# Patient Record
Sex: Male | Born: 1950 | Race: White | Hispanic: No | Marital: Single
Health system: Southern US, Community
[De-identification: ages and names within clinical notes are randomized; demographics above are authoritative.]

## PROBLEM LIST (undated history)

## (undated) DIAGNOSIS — I10 Essential (primary) hypertension: Secondary | ICD-10-CM

## (undated) DIAGNOSIS — E119 Type 2 diabetes mellitus without complications: Secondary | ICD-10-CM

---

## 2020-08-20 ENCOUNTER — Other Ambulatory Visit: Payer: Self-pay

## 2020-08-20 ENCOUNTER — Emergency Department (HOSPITAL_COMMUNITY): Payer: Medicare (Managed Care)

## 2020-08-20 ENCOUNTER — Encounter (HOSPITAL_COMMUNITY): Payer: Self-pay

## 2020-08-20 ENCOUNTER — Inpatient Hospital Stay (HOSPITAL_COMMUNITY)
Admission: EM | Admit: 2020-08-20 | Discharge: 2020-08-23 | DRG: 093 | Disposition: A | Discharge status: Home / Self Care | Payer: Medicare (Managed Care) | Attending: Internal Medicine | Admitting: Internal Medicine

## 2020-08-20 DIAGNOSIS — R270 Ataxia, unspecified: Secondary | ICD-10-CM

## 2020-08-20 DIAGNOSIS — I6389 Other cerebral infarction: Secondary | ICD-10-CM | POA: Diagnosis not present

## 2020-08-20 DIAGNOSIS — I6522 Occlusion and stenosis of left carotid artery: Secondary | ICD-10-CM | POA: Diagnosis present

## 2020-08-20 DIAGNOSIS — G43109 Migraine with aura, not intractable, without status migrainosus: Secondary | ICD-10-CM | POA: Diagnosis present

## 2020-08-20 DIAGNOSIS — Z20822 Contact with and (suspected) exposure to covid-19: Secondary | ICD-10-CM | POA: Diagnosis present

## 2020-08-20 DIAGNOSIS — H5500 Unspecified nystagmus: Secondary | ICD-10-CM | POA: Diagnosis present

## 2020-08-20 DIAGNOSIS — Z7984 Long term (current) use of oral hypoglycemic drugs: Secondary | ICD-10-CM | POA: Diagnosis not present

## 2020-08-20 DIAGNOSIS — Z7982 Long term (current) use of aspirin: Secondary | ICD-10-CM

## 2020-08-20 DIAGNOSIS — I639 Cerebral infarction, unspecified: Secondary | ICD-10-CM | POA: Diagnosis not present

## 2020-08-20 DIAGNOSIS — Z8673 Personal history of transient ischemic attack (TIA), and cerebral infarction without residual deficits: Secondary | ICD-10-CM | POA: Diagnosis not present

## 2020-08-20 DIAGNOSIS — Z79899 Other long term (current) drug therapy: Secondary | ICD-10-CM | POA: Diagnosis not present

## 2020-08-20 DIAGNOSIS — I10 Essential (primary) hypertension: Secondary | ICD-10-CM | POA: Diagnosis present

## 2020-08-20 DIAGNOSIS — I671 Cerebral aneurysm, nonruptured: Principal | ICD-10-CM

## 2020-08-20 DIAGNOSIS — E7800 Pure hypercholesterolemia, unspecified: Secondary | ICD-10-CM | POA: Diagnosis not present

## 2020-08-20 DIAGNOSIS — R42 Dizziness and giddiness: Secondary | ICD-10-CM | POA: Diagnosis present

## 2020-08-20 DIAGNOSIS — Z823 Family history of stroke: Secondary | ICD-10-CM

## 2020-08-20 DIAGNOSIS — E119 Type 2 diabetes mellitus without complications: Secondary | ICD-10-CM | POA: Diagnosis present

## 2020-08-20 DIAGNOSIS — I2541 Coronary artery aneurysm: Secondary | ICD-10-CM

## 2020-08-20 DIAGNOSIS — I672 Cerebral atherosclerosis: Secondary | ICD-10-CM | POA: Diagnosis present

## 2020-08-20 DIAGNOSIS — R278 Other lack of coordination: Secondary | ICD-10-CM | POA: Diagnosis present

## 2020-08-20 HISTORY — DX: Essential (primary) hypertension: I10

## 2020-08-20 HISTORY — DX: Type 2 diabetes mellitus without complications: E11.9

## 2020-08-20 LAB — I-STAT CHEM 8, ED
BUN: 20 mg/dL (ref 8–23)
Calcium, Ion: 1.17 mmol/L (ref 1.15–1.40)
Chloride: 109 mmol/L (ref 98–111)
Creatinine, Ser: 1.2 mg/dL (ref 0.61–1.24)
Glucose, Bld: 145 mg/dL — ABNORMAL HIGH (ref 70–99)
HCT: 45 % (ref 39.0–52.0)
Hemoglobin: 15.3 g/dL (ref 13.0–17.0)
Potassium: 4.4 mmol/L (ref 3.5–5.1)
Sodium: 139 mmol/L (ref 135–145)
TCO2: 22 mmol/L (ref 22–32)

## 2020-08-20 LAB — PROTIME-INR
INR: 1 (ref 0.8–1.2)
Prothrombin Time: 12.4 seconds (ref 11.4–15.2)

## 2020-08-20 LAB — COMPREHENSIVE METABOLIC PANEL
ALT: 31 U/L (ref 0–44)
AST: 23 U/L (ref 15–41)
Albumin: 4.3 g/dL (ref 3.5–5.0)
Alkaline Phosphatase: 50 U/L (ref 38–126)
Anion gap: 9 (ref 5–15)
BUN: 16 mg/dL (ref 8–23)
CO2: 23 mmol/L (ref 22–32)
Calcium: 9.7 mg/dL (ref 8.9–10.3)
Chloride: 106 mmol/L (ref 98–111)
Creatinine, Ser: 1.21 mg/dL (ref 0.61–1.24)
GFR, Estimated: >60 mL/min (ref >60)
Glucose, Bld: 153 mg/dL — ABNORMAL HIGH (ref 70–99)
Potassium: 4.4 mmol/L (ref 3.5–5.1)
Sodium: 138 mmol/L (ref 135–145)
Total Bilirubin: 0.8 mg/dL (ref 0.3–1.2)
Total Protein: 7.1 g/dL (ref 6.5–8.1)

## 2020-08-20 LAB — DIFFERENTIAL
Abs Immature Granulocytes: 0.01 10*3/uL (ref 0.00–0.07)
Basophils Absolute: 0.1 10*3/uL (ref 0.0–0.1)
Basophils Relative: 1 %
Eosinophils Absolute: 0.1 10*3/uL (ref 0.0–0.5)
Eosinophils Relative: 2 %
Immature Granulocytes: 0 %
Lymphocytes Relative: 28 %
Lymphs Abs: 1.6 10*3/uL (ref 0.7–4.0)
Monocytes Absolute: 0.7 10*3/uL (ref 0.1–1.0)
Monocytes Relative: 13 %
Neutro Abs: 3.2 10*3/uL (ref 1.7–7.7)
Neutrophils Relative %: 56 %

## 2020-08-20 LAB — CBC
HCT: 45.9 % (ref 39.0–52.0)
Hemoglobin: 14.9 g/dL (ref 13.0–17.0)
MCH: 30.9 pg (ref 26.0–34.0)
MCHC: 32.5 g/dL (ref 30.0–36.0)
MCV: 95.2 fL (ref 80.0–100.0)
Platelets: 210 10*3/uL (ref 150–400)
RBC: 4.82 MIL/uL (ref 4.22–5.81)
RDW: 13 % (ref 11.5–15.5)
WBC: 5.7 10*3/uL (ref 4.0–10.5)
nRBC: 0 % (ref 0.0–0.2)

## 2020-08-20 LAB — RESP PANEL BY RT-PCR (FLU A&B, COVID) ARPGX2
Influenza A by PCR: NEGATIVE
Influenza B by PCR: NEGATIVE
SARS Coronavirus 2 by RT PCR: NEGATIVE

## 2020-08-20 LAB — APTT: aPTT: 27 seconds (ref 24–36)

## 2020-08-20 LAB — CBG MONITORING, ED: Glucose-Capillary: 103 mg/dL — ABNORMAL HIGH (ref 70–99)

## 2020-08-20 MED ORDER — METFORMIN HCL 500 MG PO TABS
1000.0000 mg | ORAL_TABLET | Freq: Two times a day (BID) | ORAL | Status: DC
  Administered 2020-08-21: 1000 mg via ORAL
  Filled 2020-08-20: qty 2

## 2020-08-20 MED ORDER — STROKE: EARLY STAGES OF RECOVERY BOOK
Freq: Once | Status: AC
  Filled 2020-08-20: qty 1

## 2020-08-20 MED ORDER — ACETAMINOPHEN 160 MG/5ML PO SOLN: 650.0000 mg | ORAL | Status: DC | PRN

## 2020-08-20 MED ORDER — INSULIN ASPART 100 UNIT/ML ~~LOC~~ SOLN
0.0000 [IU] | Freq: Three times a day (TID) | SUBCUTANEOUS | Status: DC
  Administered 2020-08-23 (×2): 2 [IU] via SUBCUTANEOUS
  Administered 2020-08-23: 3 [IU] via SUBCUTANEOUS

## 2020-08-20 MED ORDER — LOSARTAN POTASSIUM 50 MG PO TABS
100.0000 mg | ORAL_TABLET | Freq: Every day | ORAL | Status: DC
  Administered 2020-08-22 – 2020-08-23 (×2): 100 mg via ORAL
  Filled 2020-08-20 (×2): qty 2

## 2020-08-20 MED ORDER — HYDROCHLOROTHIAZIDE 12.5 MG PO CAPS: 12.5000 mg | ORAL_CAPSULE | Freq: Every day | ORAL | Status: DC

## 2020-08-20 MED ORDER — SODIUM CHLORIDE 0.9% FLUSH
3.0000 mL | Freq: Once | INTRAVENOUS | Status: AC
  Administered 2020-08-20: 3 mL via INTRAVENOUS

## 2020-08-20 MED ORDER — IOHEXOL 350 MG/ML SOLN
75.0000 mL | Freq: Once | INTRAVENOUS | Status: AC | PRN
  Administered 2020-08-20: 75 mL via INTRAVENOUS

## 2020-08-20 MED ORDER — ACETAMINOPHEN 650 MG RE SUPP: 650.0000 mg | RECTAL | Status: DC | PRN

## 2020-08-20 MED ORDER — ATORVASTATIN CALCIUM 40 MG PO TABS
40.0000 mg | ORAL_TABLET | Freq: Every day | ORAL | Status: DC
  Administered 2020-08-22 – 2020-08-23 (×2): 40 mg via ORAL
  Filled 2020-08-20 (×2): qty 1

## 2020-08-20 MED ORDER — ACETAMINOPHEN 325 MG PO TABS: 650.0000 mg | ORAL_TABLET | ORAL | Status: DC | PRN

## 2020-08-20 NOTE — Hospital Course (Signed)
#   Multiple intracranial aneurysms  7 mm left ICA fusiform aneurysm, 5 mm left cavernous left ICA, and bilateral MCA aneurysm. ?? Personal hx of connective tissue disease. No family history of aneurysms.   - Neurology consulted; appreciate their recommendations  - MRI pending  - LP consideration pending MRI results   # HTN Home medication: Losartan 100 mg. Will need tight BP control.   - Restart Losartan 100 mg    # Type 2 Diabetes Mellitus  Home meds: Januvia and Metformin.   - A1c pending - SSI   # HLD - Continue home Atorvastatin   # Hx of Migraines No headache currently

## 2020-08-20 NOTE — ED Notes (Signed)
Off floor to MRI

## 2020-08-20 NOTE — H&P (Addendum)
NAME:  Ricky Santana, MRN:  [id number], DOB:  1951/07/17, LOS: 0 ADMISSION DATE:  08/20/2020, Primary: Pcp, No  CHIEF COMPLAINT:  Gait abnormality   Medical Service: Internal Medicine Teaching Service         Attending Physician: Dr. Anne Shutter, MD    First Contact: Dr. Laural Benes Pager: [phone]  Second Contact: Dr. Huel Cote Pager: [phone]       After Hours (After 5p/  First Contact Pager: [phone]  weekends / holidays): Second Contact Pager: [phone]    History of present illness   72 yom with hypertension and diabetes to presented to [facility] 12/26 for evaluation of dizziness and gait disturbance.  He notes that he awoke in the middle of the night, prior to admission, and was dizzy. He went back to sleep and found that the dizziness was still present. When he attempted to walk, he had to hold onto the walls and railing as he felt off balance. Upon getting downstairs, his sister checked his blood pressure and found it to be 170/90.  He does endorse experiencing a headache and some neck discomfort at the time which has since resolved. Denies recent fever  He denies experiencing chest pain, shortness of breath, palpitations, syncope, focal weakness, speech change.  He is currently visiting family in [place] for the holiday. He resides in [place] and is retired. He denies any recent illness or other symptoms.  He denies prior strokes or heart issues. He notes that his PCP has had to stop some of his blood pressure medications due to lower blood pressures. His baseline blood pressures have been running in the low 100s systolically lately.  He denies illicit substance use or alcohol use preceding the event.  Past Medical History  He,  has a past medical history of Diabetes mellitus without complication (HCC) and Hypertension.   Home Medications     Prior to Admission medications   Medication Sig Start Date End Date Taking? Authorizing Provider  aspirin EC 81 MG tablet Take 81 mg  by mouth daily. Swallow whole.   Yes [provider]  atorvastatin (LIPITOR) 40 MG tablet Take 40 mg by mouth daily. 07/23/20  Yes [provider]  Glucosamine-Chondroitin-MSM (TRIPLE FLEX PO) Take 2 tablets by mouth daily.   Yes [provider]  JANUVIA 100 MG tablet Take 100 mg by mouth daily. 08/12/20  Yes [provider]  losartan (COZAAR) 100 MG tablet Take 100 mg by mouth daily. 06/03/20  Yes [provider]  metFORMIN (GLUCOPHAGE) 1000 MG tablet Take 1,000 mg by mouth 2 (two) times daily. 04/27/20  Yes [provider]    Allergies    Allergies as of 08/20/2020  . (No Known Allergies)    Social History  Resides in [place]. Denies tobacco and illicit drug use. Uses alcohol socially  Family History   Mother-TIA Father- stroke  ROS  Review of Systems  Constitutional: Negative for chills and fever.  HENT: Negative for hearing loss and tinnitus.   Eyes: Negative for blurred vision and double vision.  Respiratory: Negative for cough and shortness of breath.   Cardiovascular: Negative for chest pain, palpitations, orthopnea and leg swelling.  Gastrointestinal: Negative for abdominal pain, nausea and vomiting.  Genitourinary: Negative for dysuria and frequency.  Musculoskeletal: Positive for joint pain and neck pain. Negative for falls.  Neurological: Positive for dizziness and headaches. Negative for sensory change, speech change, seizures and loss of consciousness.  Psychiatric/Behavioral: Negative for depression and substance abuse.  Objective   Blood pressure (!) 152/96, pulse 69, temperature 98 F (36.7 C), temperature source Oral, resp. rate 16, height 5\' 8"  (1.727 m), weight 90.7 kg, SpO2 97 %.    Filed Weights   08/20/20 1548  Weight: 90.7 kg    Examination: GENERAL: in no acute distress  HEENT: head atraumatic. No conjunctival injection. Nares patent.  CARDIAC: RRR, no LE edema PULMONARY: breathing comfortably on  room air, lung clear ABDOMEN: soft. Nontender to palpation.  Nondistended.  NEURO: a/o x4. Normal speech, no aphasia. CN II-XII intact. Sensation intact. 5/5 strength in upper and lower extremities. Normal heel to shin bilaterally. No pronator drift. Gait is anelgesic but steady--no dizziness or lightheadedness with this. SKIN: no rash or lesions on limited exam  MSK: normal muscle tone   Significant Diagnostic Tests:   CT head without contrast: multiple small intracranial aneurysms including 59mm aneurysm in the region of left ICA terminus. No evidence of ICH. 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7     Summary  69 yom with hypertension, diabetes, and complicated migraines admitted to [facility] on 12/26 for a stroke workup after an acute onset gait unsteadiness associated with dizziness and mild headache. Stroke workup has been negative for acute ischemia however imaging has revealed multiple small aneurysms and a remote cerebellar infarct. He requires inpatient admission for ongoing evaluation for symptoms and specialist recommendations regarding intracranial  aneurysms which is suspected to require >2 midnights.  Assessment & Plan:  Active Problems:   Acute ataxia  Acute onset gait disturbance associated with dizziness. Resolved on admission.  -no electrolyte derangements on admission labs -no history of arrhythmia and EKG on admission suggests normal sinus rhythm. 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 Pt denies a prior stroke diagnosis. ] I considered recrudescense of prior cerebellar infarct as pt notes that he has had issues with lower blood pressures resulting in de-escalation of his antihypertensives Can not r/o TIA Imaging reports do not suggest PRES and would consider this to be an abnormal presentation.   Hx of complicated migraines with sensory disturbance. Pt notes that his current sx are not similar to his typical sx.  Plan Appreciate neurology recommendations which are as follows: -echocardiogram -continue statin, aspirin -PT/OT -q2h neurochecks -normotensive blood pressure goal given intracranial aneurysms -add TSH and RPR to prior labs -tele monitoring -orthostatic vital signs  Multiple small intracranial aneurysms incidentally noted on head imaging. Pt did note having a mild headache and neck discomfort concerning for bleed however CTA/MRI imaging reports are not suggestive of this. The headache and neck discomfort are now resolved. -SCDs only at this time. Appreciate neurology recommendations for appropriateness to start pharmaceutical VTE prophylaxis if pt is to remain inpatient Appreciate neurology and neuroendovascular recommendations -blood pressure goal is normotension -continue to monitor for recurrent headache/neck pain, neuro changes--page neurology stat if this occurs as it could indicate a bleed -will go for angio with neuro  IR in AM   Chronic hypertension. On losartan 100mg  daily at home. Denies any missed doses and notes that his baseline systolic pressures run in the low 100s. Per patient's report, his PCP has had to discontinue some antihypertensives recently. Blood pressure has been elevated here since admission. Unclear reasoning for this as he reports compliance with medications -continue losartan.  -blood pressure goal is normotension.   Type 2 DM. On metformin and jardiance at home -continue home dose of metformin -moderate SSI -check A1C  Best practice:  CODE STATUS: FULL Diet: HH DVT for prophylaxis: SCDs Social considerations/Family communication: he is updating his sister in law Dispo: Admit patient to Inpatient with expected length of stay greater than 2 midnights.   , MD Internal Medicine Resident PGY-2 [facility] Pager: [phone] 08/20/2020 10:32 PM

## 2020-08-20 NOTE — ED Triage Notes (Signed)
Patient reports that he went to bed and felt normal at 10pm. Reports dizziness during the night and awoke with headache. States that he feels unsteady on feet related to dizziness. Normal grip and no facial droop

## 2020-08-20 NOTE — ED Provider Notes (Signed)
[facility] EMERGENCY DEPARTMENT Provider Note   CSN: [id number] Arrival date & time: 08/20/20  1041     History No chief complaint on file.   Ricky Santana is a 69 y.o. male.  He is visiting family.  He said since last evening he is felt more unsteady on his feet.  He woke up today and had some of the same.  Has to grab onto things to walk.  Says he has a mild headache and a little pain in his neck left lateral.  No blurry vision double vision.  No numbness or weakness.  No falls.  States his blood pressure was elevated.  No recent illness.  The history is provided by the patient.  Dizziness Quality:  Imbalance Severity:  Moderate Onset quality:  Gradual Duration:  16 hours Timing:  Constant Progression:  Unchanged Chronicity:  New Context: physical activity and standing up   Relieved by:  None tried Worsened by:  Standing up Ineffective treatments:  None tried Associated symptoms: headaches   Associated symptoms: no blood in stool, no chest pain, no nausea, no shortness of breath, no syncope, no tinnitus, no vision changes, no vomiting and no weakness   Risk factors: no hx of stroke        Past Medical History:  Diagnosis Date  . Diabetes mellitus without complication (HCC)   . Hypertension     Patient Active Problem List   Diagnosis Date Noted  . Acute ataxia 08/20/2020    History reviewed. No pertinent surgical history.     History reviewed. No pertinent family history.     Home Medications Prior to Admission medications   Medication Sig Start Date End Date Taking? Authorizing Provider  aspirin EC 81 MG tablet Take 81 mg by mouth daily. Swallow whole.   Yes [provider]  atorvastatin (LIPITOR) 40 MG tablet Take 40 mg by mouth daily. 07/23/20  Yes [provider]  Glucosamine-Chondroitin-MSM (TRIPLE FLEX PO) Take 2 tablets by mouth daily.   Yes [provider]  JANUVIA 100 MG tablet Take 100 mg by mouth daily.  08/12/20  Yes [provider]  losartan (COZAAR) 100 MG tablet Take 100 mg by mouth daily. 06/03/20  Yes [provider]  metFORMIN (GLUCOPHAGE) 1000 MG tablet Take 1,000 mg by mouth 2 (two) times daily. 04/27/20  Yes [provider]    Allergies    Patient has no known allergies.  Review of Systems   Review of Systems  Constitutional: Negative for fever.  HENT: Negative for sore throat and tinnitus.   Eyes: Negative for visual disturbance.  Respiratory: Negative for shortness of breath.   Cardiovascular: Negative for chest pain and syncope.  Gastrointestinal: Negative for abdominal pain, blood in stool, nausea and vomiting.  Genitourinary: Negative for dysuria.  Musculoskeletal: Positive for neck pain.  Skin: Negative for rash.  Neurological: Positive for dizziness and headaches. Negative for speech difficulty and weakness.    Physical Exam Updated Vital Signs BP (!) 147/104   Pulse 79   Temp 98.1 F (36.7 C) (Oral)   Resp 18   SpO2 97%   Physical Exam Vitals and nursing note reviewed.  Constitutional:      Appearance: He is well-developed and well-nourished.  HENT:     Head: Normocephalic and atraumatic.  Eyes:     Conjunctiva/sclera: Conjunctivae normal.  Cardiovascular:     Rate and Rhythm: Normal rate and regular rhythm.     Heart sounds: No murmur heard.  Pulmonary:     Effort: Pulmonary effort is normal. No respiratory distress.     Breath sounds: Normal breath sounds.  Abdominal:     Palpations: Abdomen is soft.     Tenderness: There is no abdominal tenderness.  Musculoskeletal:        General: No deformity, signs of injury or edema.     Cervical back: Neck supple.  Skin:    General: Skin is warm and dry.     Capillary Refill: Capillary refill takes less than 2 seconds.  Neurological:     General: No focal deficit present.     Mental Status: He is alert and oriented to person, place, and time.     Cranial Nerves: No cranial  nerve deficit.     Sensory: No sensory deficit.     Motor: No weakness.     Gait: Gait abnormal.     Comments: Normal finger-to-nose, no pronator drift  Psychiatric:        Mood and Affect: Mood and affect normal.     ED Results / Procedures / Treatments   Labs (all labs ordered are listed, but only abnormal results are displayed) Labs Reviewed  COMPREHENSIVE METABOLIC PANEL - Abnormal; Notable for the following components:      Result Value   Glucose, Bld 153 (*)    All other components within normal limits  I-STAT CHEM 8, ED - Abnormal; Notable for the following components:   Glucose, Bld 145 (*)    All other components within normal limits  CBG MONITORING, ED - Abnormal; Notable for the following components:   Glucose-Capillary 103 (*)    All other components within normal limits  CBG MONITORING, ED - Abnormal; Notable for the following components:   Glucose-Capillary 113 (*)    All other components within normal limits  RESP PANEL BY RT-PCR (FLU A&B, COVID) ARPGX2  PROTIME-INR  APTT  CBC  DIFFERENTIAL  TSH  RPR  LIPID PANEL  HEMOGLOBIN A1C    EKG EKG Interpretation  Date/Time:  Sunday August 20 2020 11:38:19 EST Ventricular Rate:  84 PR Interval:  186 QRS Duration: 80 QT Interval:  398 QTC Calculation: 470 R Axis:   14 Text Interpretation: Sinus rhythm with occasional Premature ventricular complexes Anterior infarct , age undetermined Abnormal ECG No old tracing to compare Confirmed by Meridee Score [phone]) on 08/20/2020 3:02:50 PM   Radiology CT Angio Head W/Cm &/Or Wo Cm  Result Date: 08/20/2020 CLINICAL DATA:  Dizziness, unsteadiness, and headache. EXAM: CT ANGIOGRAPHY HEAD AND NECK TECHNIQUE: Multidetector CT imaging of the head and neck was performed using the standard protocol during bolus administration of intravenous contrast. Multiplanar CT image reconstructions and MIPs were obtained to evaluate the vascular anatomy. Carotid stenosis  measurements (when applicable) are obtained utilizing NASCET criteria, using the distal internal carotid diameter as the denominator. CONTRAST:  75mL OMNIPAQUE IOHEXOL 350 MG/ML SOLN COMPARISON:  Head CT 08/20/2020 FINDINGS: CTA NECK FINDINGS Aortic arch: Standard 3 vessel aortic arch with widely patent arch vessel origins. Right carotid system: Patent with minimal calcified and soft plaque in the carotid bulb. No evidence of stenosis or dissection. Left carotid system: Patent without evidence of stenosis or dissection. Vertebral arteries: Patent with the left being strongly dominant. No evidence of a significant stenosis or dissection. Skeleton: Mild cervical disc degeneration. Other neck: No evidence of cervical lymphadenopathy or mass. Upper chest: Calcified granuloma in the left upper lobe. Review of the MIP images confirms the above findings CTA HEAD  FINDINGS Anterior circulation: The internal carotid arteries are patent from skull base to carotid termini without evidence of a significant stenosis. A medially projecting aneurysm from the left cavernous ICA measures 5 x 4 mm. There is a fusiform aneurysm of the left ICA terminus which measures 7 mm in diameter. There is a 4 mm left M2 branch vessel aneurysm in the sylvian fissure. A right M1 segment aneurysm measures 5 x 4 mm. ACAs and MCAs are patent with mild diffuse irregularity but no evidence of a proximal branch occlusion or flow limiting proximal stenosis. The right A1 segment is diminutive or absent. Posterior circulation: The intracranial left vertebral artery is widely patent to the basilar. The right vertebral artery is hypoplastic and particularly diminutive distal to the PICA origin. The basilar artery is patent and mildly irregular diffusely without a significant focal stenosis. There are large right and small left posterior communicating arteries with hypoplasia of the right P1 segment. The PCAs are patent and mildly irregular diffusely without a  flow limiting proximal stenosis. No aneurysm is identified. Venous sinuses: Patent. Anatomic variants: Fetal right PCA.  Diminutive or absent right A1. Review of the MIP images confirms the above findings IMPRESSION: 1. Intracranial atherosclerosis without large vessel occlusion or flow limiting proximal stenosis. 2. 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 3. 5 mm left cavernous ICA aneurysm. 4. 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 5. Widely patent cervical carotid and vertebral arteries. Electronically Signed   By: Sebastian Ache M.D.   On: 08/20/2020 17:11   CT HEAD WO CONTRAST  Result Date: 08/20/2020 CLINICAL DATA:  Neuro deficit, acute stroke suspected.  Dizziness. EXAM: CT HEAD WITHOUT CONTRAST TECHNIQUE: Contiguous axial images were obtained from the base of the skull through the vertex without intravenous contrast. COMPARISON:  None. FINDINGS: Brain: No evidence of acute large vascular territory infarction, hemorrhage, hydrocephalus, extra-axial collection. Small remote appearing lacunar infarct in the right cerebellum. Patchy white matter hypoattenuation, nonspecific but most likely related to chronic microvascular ischemic disease. Partially empty sella. Vascular: There are multiple suspected aneurysms, including apparent aneurysms in the region left carotid terminus and bilateral MCAs. The left ICA terminus aneurysm measures at least 7 mm. Skull: No acute fracture. Sinuses/Orbits: Small amount of frothy secretions in the right maxillary sinus. Unremarkable orbits. Other: No mastoid effusions. IMPRESSION: 1. 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 These are suboptimally evaluated on this noncontrast CT. Recommend CTA of the head to further evaluate. No evidence of acute hemorrhage. 2. 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 3. Small amount of fat density ventral to the right midbrain, which most likely represents a  lipoma. Intracranial dermoid is a differential consideration. Nonurgent MRI with contrast could further evaluate if clinically indicated. 4. Chronic microvascular ischemic disease. Findings and recommendations discussed with Dr. Charm Barges via telephone at 12:56 p.m. Electronically Signed   By: Feliberto Harts MD   On: 08/20/2020 13:01   CT Angio Neck W and/or Wo Contrast  Result Date: 08/20/2020 CLINICAL DATA:  Dizziness, unsteadiness, and headache. EXAM: CT ANGIOGRAPHY HEAD AND NECK TECHNIQUE: Multidetector CT imaging of the head and neck was performed using the standard protocol during bolus administration of intravenous contrast. Multiplanar CT image reconstructions and MIPs were obtained to evaluate the vascular anatomy. Carotid stenosis measurements (when applicable) are obtained utilizing NASCET criteria, using the distal internal carotid diameter as the denominator. CONTRAST:  89mL OMNIPAQUE IOHEXOL 350 MG/ML  SOLN COMPARISON:  Head CT 08/20/2020 FINDINGS: CTA NECK FINDINGS Aortic arch: Standard 3 vessel aortic arch with widely patent arch vessel origins. Right carotid system: Patent with minimal calcified and soft plaque in the carotid bulb. No evidence of stenosis or dissection. Left carotid system: Patent without evidence of stenosis or dissection. Vertebral arteries: Patent with the left being strongly dominant. No evidence of a significant stenosis or dissection. Skeleton: Mild cervical disc degeneration. Other neck: No evidence of cervical lymphadenopathy or mass. Upper chest: Calcified granuloma in the left upper lobe. Review of the MIP images confirms the above findings CTA HEAD FINDINGS Anterior circulation: The internal carotid arteries are patent from skull base to carotid termini without evidence of a significant stenosis. A medially projecting aneurysm from the left cavernous ICA measures 5 x 4 mm. There is a fusiform aneurysm of the left ICA terminus which measures 7 mm in diameter. There is  a 4 mm left M2 branch vessel aneurysm in the sylvian fissure. A right M1 segment aneurysm measures 5 x 4 mm. ACAs and MCAs are patent with mild diffuse irregularity but no evidence of a proximal branch occlusion or flow limiting proximal stenosis. The right A1 segment is diminutive or absent. Posterior circulation: The intracranial left vertebral artery is widely patent to the basilar. The right vertebral artery is hypoplastic and particularly diminutive distal to the PICA origin. The basilar artery is patent and mildly irregular diffusely without a significant focal stenosis. There are large right and small left posterior communicating arteries with hypoplasia of the right P1 segment. The PCAs are patent and mildly irregular diffusely without a flow limiting proximal stenosis. No aneurysm is identified. Venous sinuses: Patent. Anatomic variants: Fetal right PCA.  Diminutive or absent right A1. Review of the MIP images confirms the above findings IMPRESSION: 1. Intracranial atherosclerosis without large vessel occlusion or flow limiting proximal stenosis. 2. 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 3. 5 mm left cavernous ICA aneurysm. 4. 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 5. Widely patent cervical carotid and vertebral arteries. Electronically Signed   By: Sebastian Ache M.D.   On: 08/20/2020 17:11   MR BRAIN WO CONTRAST  Result Date: 08/20/2020 CLINICAL DATA:  Dizziness, unsteadiness, and headache. EXAM: MRI HEAD WITHOUT CONTRAST TECHNIQUE: Multiplanar, multiecho pulse sequences of the brain and surrounding structures were obtained without intravenous contrast. COMPARISON:  Head CT and CTA 08/20/2020 FINDINGS: Brain: No acute infarct, intracranial hemorrhage, midline shift, or extra-axial fluid collection is identified. A 5 mm T1 hyperintense nodule along the right ventral midbrain corresponds to fat density on CT and is consistent with a lipoma. T2 hyperintensities in the cerebral white matter bilaterally  are nonspecific but compatible with mild-to-moderate chronic small vessel ischemic disease. Multiple small T2 hyperintensities in the midbrain primarily on the right are favored to reflect dilated perivascular spaces although a component of chronic ischemia is possible as well. There are small chronic right cerebellar infarcts. There is mild cerebral atrophy. A partially empty sella is noted. Vascular: Major intracranial vascular flow voids are preserved. Multiple cerebral aneurysms as detailed on today's earlier CTA. 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 Sinuses/Orbits: Unremarkable orbits. Minimal secretions in the right maxillary sinus. Other: None. IMPRESSION: 1. No acute intracranial abnormality. 2. Mild-to-moderate chronic small vessel ischemic disease. 3. Small chronic cerebellar infarcts. 4. 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 Electronically Signed   By: Jolaine Click.D.  On: 08/20/2020 18:46    Procedures Procedures (including critical care time)  Medications Ordered in ED Medications  atorvastatin (LIPITOR) tablet 40 mg (40 mg Oral Not Given 08/21/20 0824)  losartan (COZAAR) tablet 100 mg (100 mg Oral Not Given 08/21/20 0824)  metFORMIN (GLUCOPHAGE) tablet 1,000 mg (1,000 mg Oral Not Given 08/21/20 [id number])  acetaminophen (TYLENOL) tablet 650 mg (has no administration in time range)    Or  acetaminophen (TYLENOL) 160 MG/5ML solution 650 mg (has no administration in time range)    Or  acetaminophen (TYLENOL) suppository 650 mg (has no administration in time range)  insulin aspart (novoLOG) injection 0-15 Units (0 Units Subcutaneous Not Given 08/21/20 0821)  sodium chloride flush (NS) 0.9 % injection 3 mL (3 mLs Intravenous Given 08/20/20 1549)  iohexol (OMNIPAQUE) 350 MG/ML injection 75 mL (75 mLs Intravenous Contrast Given 08/20/20 1640)   stroke: mapping our early stages of recovery book ( Does not  apply Given 08/21/20 [id number])    ED Course  I have reviewed the triage vital signs and the nursing notes.  Pertinent labs & imaging results that were available during my care of the patient were reviewed by me and considered in my medical decision making (see chart for details).  Clinical Course as of 08/21/20 [id number]  Wynelle LinkSun Aug 20, 2020  1521 Review with neurology Dr. Napoleon FormAbsher.  He is recommending CTA head and neck. [MB]  1524 She is seen by Dr. Napoleon FormAbsher.  His recommendations are admission to the hospital for stroke work-up.  MRI if patient's here implant is compatible.  He does not feel the patient needs an LP at this time.  Neurology will follow along. [MB]  1802 Discussed with internal medicine team who will evaluate the patient for admission. [MB]    Clinical Course User Index [MB] Terrilee FilesButler, Nazli Penn C, MD   MDM Rules/Calculators/A&P                         This patient complains of unsteady gait and imbalance; this involves an extensive number of treatment Options and is a complaint that carries with it a high risk of complications and Morbidity. The differential includes stroke, TIA, metabolic derangement, hypoglycemia, anemia, hypovolemia  I ordered, reviewed and interpreted labs, which included CBC with normal white count normal hemoglobin, chemistries normal other than mildly elevated glucose, Covid testing negative I ordered imaging studies which included CT head and MRI brain along with CTA head and neck and I independently    visualized and interpreted imaging which showed multiple cerebral aneurysms, old lacunar stroke Additional information provided by patient's sister-in-law Previous records obtained and reviewed in epic, no prior records.  [place] in [place] I consulted Dr. Napoleon FormAbsher neuro hospitalist and internal medicine teaching service and discussed lab and imaging findings  Critical Interventions: None  After the interventions  stated above, I reevaluated the patient and found patient still to be symptomatic.  Neurology is recommending admission to the hospital for further stroke work-up.  Dr. Derry LoryKhaliqdina later message to me that the patient was going to be reevaluated by Dr. Jon Billingsevishwar for possible angiogram in am and has made him NPO after midnight.    Final Clinical Impression(s) / ED Diagnoses Final diagnoses:  Ataxia    Rx / DC Orders ED Discharge Orders    None       Terrilee FilesButler, Biridiana Twardowski C, MD 08/21/20 [phone]

## 2020-08-20 NOTE — Consult Note (Addendum)
Neurology Consultation Reason for Consult: possible stroke, multiple aneurysms Referring Physician: Dr. Charm Barges  CC: unsteady gait  History is obtained from: patient, his wife, and ED team  HPI: Ricky Santana is a 69 y.o. male with a history of HTN, DM2, childhood-onset complicated migraines (sensory symptoms). He noticed feeling dizzy last night. This morning when he awoke, he continued to feel unsteady this morning, and also noticed a mild bifrontal headache (2/10 in severity). There was also some neck discomfort, mild in degree. These symptoms are atypical for him. He rarely has migraines, and this does not seem the same as his usual migraines. When he came to the ED, he was noted to have the unsteady gait, and to prefer holding onto things when he walked. He denies blurred vision, diplopia, numbness, speech/swallowing trouble, weakness, trauma, or other neurological problems. Although there is a history of stroke in his family, there are no others affected by migraine. A stroke alert was not called, as he was out of the time window, and his symptoms of mild dizziness and gait disturbance were not felt to be strongly indicative of a CNS origin. A head CT was done, and was read as showing:  "1. 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 These are suboptimally evaluated on this noncontrast CT. Recommend CTA of the head to further evaluate. No evidence of acute hemorrhage. 2. 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 3. 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 Intracranial dermoid is a differential consideration. Nonurgent MRI with contrast could further evaluate if clinically indicated. 4. Chronic microvascular ischemic disease."  I received a call after the CT, and recommended CTA head and neck (see below) as well as hospitalization for additional stroke  workup.  LKW: last night (dizzy) tpa given?: no, out of time window Premorbid modified rankin scale: 1 ICH Score: n/a  ROS: A 14 point ROS was performed and is negative except as noted in the HPI. He has hearing loss and has a device/implant in the right ear (with a card in his wallet suggesting it may be MR compatible).  Past Medical History:  Diagnosis Date  . Diabetes mellitus without complication (HCC)   . Hypertension     No family history on file. Stroke +, no h/o aneurysm, no migraine hx in family.  Social History:  has no history on file for tobacco use, alcohol use, and drug use.   Exam: Current vital signs: BP (!) 150/96   Pulse 67   Temp 98.1 F (36.7 C) (Oral)   Resp 17   Ht 5\' 8"  (1.727 m)   Wt 90.7 kg   SpO2 98%   BMI 30.41 kg/m  Vital signs in last 24 hours: Temp:  [98.1 F (36.7 C)] 98.1 F (36.7 C) (12/26 1126) Pulse Rate:  [67-83] 67 (12/26 1530) Resp:  [17-18] 17 (12/26 1530) BP: (123-150)/(85-104) 150/96 (12/26 1530) SpO2:  [97 %-100 %] 98 % (12/26 1530) Weight:  [90.7 kg] 90.7 kg (12/26 1548)   Physical Exam  Constitutional: Appears well-developed and well-nourished.  Psych: Affect appropriate to situation Eyes: No scleral injection HENT: No OP obstrucion MSK: no joint deformities.  Cardiovascular: Normal rate and regular rhythm.  Respiratory: Effort normal, non-labored breathing GI: Soft.  No distension. There is no tenderness.  Skin: WDI  Neuro: Mental Status: Patient is awake, alert, oriented to person, place, month, year, and situation.  Patient is able to give a clear and coherent history. No signs of aphasia or neglect. Cranial Nerves: II: Visual Fields are full. Pupils are equal, round, and reactive to light. III,IV, VI: EOMI without ptosis or diploplia. A few beats of nystagmus with extreme gaze laterally, left > right, non-sustained. No skew deviation. V: Facial sensation is symmetric to temperature VII: Facial movement is  symmetric.  VIII: hearing is intact to voice X: Uvula elevates symmetrically XI: Shoulder shrug is symmetric. XII: tongue is midline without atrophy or fasciculations.  Motor: Tone is normal. Bulk is normal. 5/5 strength was present in all four extremities. No drift of either arm/leg. Sensory: Sensation is symmetric to light touch and temperature in the arms and legs. No obvious right-left discrepancy. Deep Tendon Reflexes: Areflexic throughout, with the exception of 1+ knee jerks. Plantars: Toes are downgoing bilaterally. Cerebellar: FNF and HKS are intact bilaterally. Also, FO, rapid alternating movements, and FTN tests are all normal/symmetric.  Gait: Requires assist to ambulate to bathroom. Unable to stand on his right leg.  NIHSS = 0 at this time  I have reviewed labs in epic and the results pertinent to this consultation are: Results for ZHYON, ANTENUCCI (MRN [id number]) as of 08/20/2020 17:53  Ref. Range 08/20/2020 12:06  Sodium Latest Ref Range: 135 - 145 mmol/L 139  Potassium Latest Ref Range: 3.5 - 5.1 mmol/L 4.4  Chloride Latest Ref Range: 98 - 111 mmol/L 109  Glucose Latest Ref Range: 70 - 99 mg/dL 859 (H)  BUN Latest Ref Range: 8 - 23 mg/dL 20  Creatinine Latest Ref Range: 0.61 - 1.24 mg/dL 2.92  Calcium Ionized Latest Ref Range: 1.15 - 1.40 mmol/L 1.17   Results for KAHNE, HELFAND (MRN [id number]) as of 08/20/2020 17:53  Ref. Range 08/20/2020 11:48  WBC Latest Ref Range: 4.0 - 10.5 K/uL 5.7  RBC Latest Ref Range: 4.22 - 5.81 MIL/uL 4.82  Hemoglobin Latest Ref Range: 13.0 - 17.0 g/dL 77.1  HCT Latest Ref Range: 39.0 - 52.0 % 45.9  MCV Latest Ref Range: 80.0 - 100.0 fL 95.2  MCH Latest Ref Range: 26.0 - 34.0 pg 30.9  MCHC Latest Ref Range: 30.0 - 36.0 g/dL 16.5  RDW Latest Ref Range: 11.5 - 15.5 % 13.0  Platelets Latest Ref Range: 150 - 400 K/uL 210    I have reviewed the images obtained:  CTA head and neck: "1. Intracranial atherosclerosis without large vessel  occlusion or flow limiting proximal stenosis. 2. 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 3. 5 mm left cavernous ICA aneurysm. 4. 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 5. Widely patent cervical carotid and vertebral arteries."  I would add that the left ICA terminus aneurysm appears to produce the origin of the posterior communicator:     CT head: discussed in HPI.  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 Pursue normotension - MRI brain, once compatibility of ear implant is verified; this is important to assess for findings suggesting subarachnoid, and to assess for new/recurrent stroke - Consider LP to assess for xanthochromia; if MRI shows subarachnoid blood, this would not be necessary. However, if headache persists, and MRI is negative/equivocal, I would have low threshold for CSF examination. - PT assessment for gait/balance issues - neuroendovascular consultation for timing/planning of aneurysm treatment; if there is any indication of leakage, urgent treatment may be required. - q2h neurochecks would be best in this setting, given possible cerebellar stroke. - syphilis screen   Meredeth Ide, MD

## 2020-08-21 ENCOUNTER — Inpatient Hospital Stay (HOSPITAL_COMMUNITY): Payer: Medicare (Managed Care)

## 2020-08-21 DIAGNOSIS — I6389 Other cerebral infarction: Secondary | ICD-10-CM

## 2020-08-21 DIAGNOSIS — I671 Cerebral aneurysm, nonruptured: Secondary | ICD-10-CM | POA: Diagnosis present

## 2020-08-21 DIAGNOSIS — E7800 Pure hypercholesterolemia, unspecified: Secondary | ICD-10-CM

## 2020-08-21 LAB — ECHOCARDIOGRAM COMPLETE
Area-P 1/2: 1.94 cm2
Height: 68 in
S' Lateral: 3.2 cm
Weight: 3200 oz

## 2020-08-21 LAB — CBG MONITORING, ED
Glucose-Capillary: 113 mg/dL — ABNORMAL HIGH (ref 70–99)
Glucose-Capillary: 92 mg/dL (ref 70–99)
Glucose-Capillary: 102 mg/dL — ABNORMAL HIGH (ref 70–99)

## 2020-08-21 MED ORDER — ASPIRIN EC 81 MG PO TBEC
81.0000 mg | DELAYED_RELEASE_TABLET | Freq: Every day | ORAL | Status: DC
  Administered 2020-08-21 – 2020-08-23 (×3): 81 mg via ORAL
  Filled 2020-08-21 (×3): qty 1

## 2020-08-21 NOTE — Evaluation (Signed)
Physical Therapy Evaluation Patient Details Name: Ricky Santana MRN: [id number] DOB: 04/04/1951 Today's Date: 08/21/2020   History of Present Illness  69 yo male presenting to [facility] on [facility]/20/20 with dizziness and gait disturbance.  In ED, CTA head/neck revealed incidental findings of three cerebral arteriograms.  Neurology was consulted who recommended NIR consult for possible diagnostic cerebral arteriogram to evaluate cerebral aneurysms. PMH including , migraines, and diabetes mellitus.    Clinical Impression  Pt admitted with above diagnosis. PTA pt active and independent. He lives in [place] and is in [place] visiting his brother. On eval, he required supervision transfers and supervision ambulation 150' without AD. Pt currently with functional limitations due to the deficits listed below. Pt will benefit from skilled PT to increase their independence and safety with mobility to allow discharge home. PT to follow acutely to further assess longer distances ambulation and stairs. No follow up services indicated.       Follow Up Recommendations No PT follow up    Equipment Recommendations  None recommended by PT    Recommendations for Other Services       Precautions / Restrictions Precautions Precautions: None Restrictions Weight Bearing Restrictions: No      Mobility  Bed Mobility Overal bed mobility: Modified Independent Bed Mobility: Supine to Sit;Sit to Supine     Supine to sit: Supervision Sit to supine: Supervision   General bed mobility comments: Supervision for safety    Transfers Overall transfer level: Needs assistance Equipment used: None Transfers: Sit to/from Stand;Stand Pivot Transfers Sit to Stand: Supervision Stand pivot transfers: Supervision       General transfer comment: Supervision  for safety  Ambulation/Gait Ambulation/Gait assistance: Supervision Gait Distance (Feet): 150 Feet Assistive device: None Gait Pattern/deviations: WFL(Within  Functional Limits) Gait velocity: WFL Gait velocity interpretation: >2.62 ft/sec, indicative of community ambulatory General Gait Details: steady gait  Stairs            Wheelchair Mobility    Modified Rankin (Stroke Patients Only) Modified Rankin (Stroke Patients Only) Pre-Morbid Rankin Score: No symptoms Modified Rankin: Slight disability     Balance Overall balance assessment: No apparent balance deficits (not formally assessed)                                           Pertinent Vitals/Pain Pain Assessment: No/denies pain    Home Living Family/patient expects to be discharged to:: Private residence Living Arrangements: Alone   Type of Home: Apartment (co-op in [place]) Home Access: Level entry     Home Layout: One level Home Equipment: None Additional Comments: This information is for his home; he lives in [place] and is in [place] visiting his brother.    Prior Function Level of Independence: Independent         Comments: ADLs, IADLs, drives     Hand Dominance   Dominant Hand: Right    Extremity/Trunk Assessment   Upper Extremity Assessment Upper Extremity Assessment: Overall WFL for tasks assessed    Lower Extremity Assessment Lower Extremity Assessment: Overall WFL for tasks assessed    Cervical / Trunk Assessment Cervical / Trunk Assessment: Normal  Communication   Communication: No difficulties  Cognition Arousal/Alertness: Awake/alert Behavior During Therapy: WFL for tasks assessed/performed Overall Cognitive Status: Within Functional Limits for tasks assessed  General Comments General comments (skin integrity, edema, etc.): HR 108. SpO2 96% on RA. BP pre-mobility 120/103 and post-mobility 136/95.    Exercises     Assessment/Plan    PT Assessment Patient needs continued PT services  PT Problem List Decreased mobility;Decreased activity tolerance       PT  Treatment Interventions Therapeutic exercise;Gait training;Stair training;Functional mobility training;Therapeutic activities;Patient/family education    PT Goals (Current goals can be found in the Care Plan section)  Acute Rehab PT Goals Patient Stated Goal: Go home PT Goal Formulation: With patient Time For Goal Achievement: 08/28/20 Potential to Achieve Goals: Good    Frequency Min 4X/week   Barriers to discharge        Co-evaluation   Reason for Co-Treatment: For patient/therapist safety;To address functional/ADL transfers   OT goals addressed during session: ADL's and self-care       AM-PAC PT "6 Clicks" Mobility  Outcome Measure Help needed turning from your back to your side while in a flat bed without using bedrails?: None Help needed moving from lying on your back to sitting on the side of a flat bed without using bedrails?: None Help needed moving to and from a bed to a chair (including a wheelchair)?: A Little Help needed standing up from a chair using your arms (e.g., wheelchair or bedside chair)?: A Little Help needed to walk in hospital room?: A Little Help needed climbing 3-5 steps with a railing? : A Little 6 Click Score: 20    End of Session Equipment Utilized During Treatment: Gait belt Activity Tolerance: Patient tolerated treatment well Patient left: in bed;with call bell/phone within reach Nurse Communication: Mobility status PT Visit Diagnosis: Difficulty in walking, not elsewhere classified (R26.2)    Time: 9604-5409 PT Time Calculation (min) (ACUTE ONLY): 19 min   Charges:   PT Evaluation $PT Eval Moderate Complexity: 1 Mod          Aida Raider, PT  Office # [phone] Pager [phone]   Ilda Foil 08/21/2020, 11:46 AM

## 2020-08-21 NOTE — Progress Notes (Signed)
NIR consulted by Dr. Derry Lory for possible image-guided diagnostic cerebral arteriogram. Please see my Consult Note from today for further information.  Unable to accommodate patient's procedure today secondary to bed placement (currently in ED with no bed assignment on floor, ED unable to accommodate post-procedure femoral/radial access). Plan for image-guided diagnostic cerebral arteriogram in IR tentatively for tomorrow 08/22/2020 pending NIR scheduling and bed placement. Diet restarted for today, patient will be NPO at midnight. Dr. Debbrah Alar ([facility]) and Larita Fife (patient's sister-in-law) made aware.  Please call NIR with questions/concerns.   Waylan Boga Caitlinn Klinker, PA-C 08/21/2020, 1:50 PM

## 2020-08-21 NOTE — ED Notes (Signed)
Patient denies pain and is resting comfortably.  

## 2020-08-21 NOTE — Consult Note (Signed)
Chief Complaint: Patient was seen in consultation today for cerebral aneurysms/diagnostic cerebral arteriogram.  Referring Physician(s): Erick Blinks (neurology)  Supervising Physician: Julieanne Cotton  Patient Status: [facility] - ED  History of Present Illness: Ricky Santana is a 69 y.o. male with a past medical history of hypertension, migraines, and diabetes mellitus. He presented to [facility] ED 08/20/2020 with complaints of dizziness and gait disturbances. Per notes, patient woke up in the middle of the night and was dizzy. He went back to sleep and woke up later and dizziness had not subsided. When he awoke and walked around, patient felt off balance and had to use walls to hold on for balance. In ED, CTA head/neck revealed incidental findings of three cerebral arteriograms. He was admitted for further management. Neurology was consulted who recommended NIR consult for possible diagnostic cerebral arteriogram to evaluate cerebral aneurysms.  CT head 08/20/2020: 1. 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 These are suboptimally evaluated on this noncontrast CT. Recommend CTA of the head to further evaluate. No evidence of acute hemorrhage. 2. 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 3. 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 Intracranial dermoid is a differential consideration. Nonurgent MRI with contrast could further evaluate if clinically indicated. 4. Chronic microvascular ischemic disease.  CTA head/neck 08/20/2020: 1. Intracranial atherosclerosis without large vessel occlusion or flow limiting proximal stenosis. 2. 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 3. 5 mm left cavernous ICA aneurysm. 4. 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 5. Widely patent cervical carotid and vertebral arteries.  MR brain 08/20/2020: 1. No acute intracranial  abnormality. 2. Mild-to-moderate chronic small vessel ischemic disease. 3. Small chronic cerebellar infarcts. 4. 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  NIR consulted by Dr. Derry Lory for possible image-guided diagnostic cerebral arteriogram. Patient awake and alert laying in bed with no complaints at this time. Denies fever, chills, chest pain, dyspnea, abdominal pain, or headache.   Past Medical History:  Diagnosis Date  . Diabetes mellitus without complication (HCC)   . Hypertension     History reviewed. No pertinent surgical history.  Allergies: Patient has no known allergies.  Medications: Prior to Admission medications   Medication Sig Start Date End Date Taking? Authorizing Provider  aspirin EC 81 MG tablet Take 81 mg by mouth daily. Swallow whole.   Yes [provider]  atorvastatin (LIPITOR) 40 MG tablet Take 40 mg by mouth daily. 07/23/20  Yes [provider]  Glucosamine-Chondroitin-MSM (TRIPLE FLEX PO) Take 2 tablets by mouth daily.   Yes [provider]  JANUVIA 100 MG tablet Take 100 mg by mouth daily. 08/12/20  Yes [provider]  losartan (COZAAR) 100 MG tablet Take 100 mg by mouth daily. 06/03/20  Yes [provider]  metFORMIN (GLUCOPHAGE) 1000 MG tablet Take 1,000 mg by mouth 2 (two) times daily. 04/27/20  Yes [provider]     History reviewed. No pertinent family history.  Social History   Socioeconomic History  . Marital status: Single    Spouse name: Not on file  . Number of children: Not on file  . Years of education: Not on file  . Highest education level: Not on file  Occupational History  . Not on file  Tobacco Use  . Smoking status: Not on file  . Smokeless tobacco: Not on file  Substance and Sexual  Activity  . Alcohol use: Not on file  . Drug use: Not on file  . Sexual activity: Not on file  Other Topics Concern  . Not on file  Social History Narrative  . Not on file    Social Determinants of Health   Financial Resource Strain: Not on file  Food Insecurity: Not on file  Transportation Needs: Not on file  Physical Activity: Not on file  Stress: Not on file  Social Connections: Not on file     Review of Systems: A 12 point ROS discussed and pertinent positives are indicated in the HPI above.  All other systems are negative.  Review of Systems  Constitutional: Negative for chills and fever.  Respiratory: Negative for shortness of breath and wheezing.   Cardiovascular: Negative for chest pain and palpitations.  Gastrointestinal: Negative for abdominal pain.  Neurological: Negative for headaches.  Psychiatric/Behavioral: Negative for behavioral problems and confusion.    Vital Signs: BP (!) 136/93   Pulse 74   Temp 98 F (36.7 C) (Oral)   Resp 17   Ht 5\' 8"  (1.727 m)   Wt 200 lb (90.7 kg)   SpO2 97%   BMI 30.41 kg/m   Physical Exam Vitals and nursing note reviewed.  Constitutional:      General: He is not in acute distress.    Appearance: Normal appearance.  Cardiovascular:     Rate and Rhythm: Normal rate and regular rhythm.     Heart sounds: Normal heart sounds. No murmur heard.   Pulmonary:     Effort: Pulmonary effort is normal. No respiratory distress.     Breath sounds: Normal breath sounds. No wheezing.  Skin:    General: Skin is warm and dry.  Neurological:     Mental Status: He is alert and oriented to person, place, and time.      MD Evaluation Airway: WNL Heart: WNL Abdomen: WNL Chest/ Lungs: WNL ASA  Classification: 3 Mallampati/Airway Score: Two   Imaging: CT Angio Head W/Cm &/Or Wo Cm  Result Date: 08/20/2020 CLINICAL DATA:  Dizziness, unsteadiness, and headache. EXAM: CT ANGIOGRAPHY HEAD AND NECK TECHNIQUE: Multidetector CT imaging of the head and neck was performed using the standard protocol during bolus administration of intravenous contrast. Multiplanar CT image reconstructions and MIPs were  obtained to evaluate the vascular anatomy. Carotid stenosis measurements (when applicable) are obtained utilizing NASCET criteria, using the distal internal carotid diameter as the denominator. CONTRAST:  75mL OMNIPAQUE IOHEXOL 350 MG/ML SOLN COMPARISON:  Head CT 08/20/2020 FINDINGS: CTA NECK FINDINGS Aortic arch: Standard 3 vessel aortic arch with widely patent arch vessel origins. Right carotid system: Patent with minimal calcified and soft plaque in the carotid bulb. No evidence of stenosis or dissection. Left carotid system: Patent without evidence of stenosis or dissection. Vertebral arteries: Patent with the left being strongly dominant. No evidence of a significant stenosis or dissection. Skeleton: Mild cervical disc degeneration. Other neck: No evidence of cervical lymphadenopathy or mass. Upper chest: Calcified granuloma in the left upper lobe. Review of the MIP images confirms the above findings CTA HEAD FINDINGS Anterior circulation: The internal carotid arteries are patent from skull base to carotid termini without evidence of a significant stenosis. A medially projecting aneurysm from the left cavernous ICA measures 5 x 4 mm. There is a fusiform aneurysm of the left ICA terminus which measures 7 mm in diameter. There is a 4 mm left M2 branch vessel aneurysm in the sylvian fissure. A right M1 segment  aneurysm measures 5 x 4 mm. ACAs and MCAs are patent with mild diffuse irregularity but no evidence of a proximal branch occlusion or flow limiting proximal stenosis. The right A1 segment is diminutive or absent. Posterior circulation: The intracranial left vertebral artery is widely patent to the basilar. The right vertebral artery is hypoplastic and particularly diminutive distal to the PICA origin. The basilar artery is patent and mildly irregular diffusely without a significant focal stenosis. There are large right and small left posterior communicating arteries with hypoplasia of the right P1 segment.  The PCAs are patent and mildly irregular diffusely without a flow limiting proximal stenosis. No aneurysm is identified. Venous sinuses: Patent. Anatomic variants: Fetal right PCA.  Diminutive or absent right A1. Review of the MIP images confirms the above findings IMPRESSION: 1. Intracranial atherosclerosis without large vessel occlusion or flow limiting proximal stenosis. 2. 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 3. 5 mm left cavernous ICA aneurysm. 4. 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 5. Widely patent cervical carotid and vertebral arteries. Electronically Signed   By: Sebastian Ache M.D.   On: 08/20/2020 17:11   CT HEAD WO CONTRAST  Result Date: 08/20/2020 CLINICAL DATA:  Neuro deficit, acute stroke suspected.  Dizziness. EXAM: CT HEAD WITHOUT CONTRAST TECHNIQUE: Contiguous axial images were obtained from the base of the skull through the vertex without intravenous contrast. COMPARISON:  None. FINDINGS: Brain: No evidence of acute large vascular territory infarction, hemorrhage, hydrocephalus, extra-axial collection. Small remote appearing lacunar infarct in the right cerebellum. Patchy white matter hypoattenuation, nonspecific but most likely related to chronic microvascular ischemic disease. Partially empty sella. Vascular: There are multiple suspected aneurysms, including apparent aneurysms in the region left carotid terminus and bilateral MCAs. The left ICA terminus aneurysm measures at least 7 mm. Skull: No acute fracture. Sinuses/Orbits: Small amount of frothy secretions in the right maxillary sinus. Unremarkable orbits. Other: No mastoid effusions. IMPRESSION: 1. 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 These are suboptimally evaluated on this noncontrast CT. Recommend CTA of the head to further evaluate. No evidence of acute hemorrhage. 2. 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 3. Small amount of fat density  ventral to the right midbrain, which most likely represents a lipoma. Intracranial dermoid is a differential consideration. Nonurgent MRI with contrast could further evaluate if clinically indicated. 4. Chronic microvascular ischemic disease. Findings and recommendations discussed with Dr. Charm Barges via telephone at 12:56 p.m. Electronically Signed   By: Feliberto Harts MD   On: 08/20/2020 13:01   CT Angio Neck W and/or Wo Contrast  Result Date: 08/20/2020 CLINICAL DATA:  Dizziness, unsteadiness, and headache. EXAM: CT ANGIOGRAPHY HEAD AND NECK TECHNIQUE: Multidetector CT imaging of the head and neck was performed using the standard protocol during bolus administration of intravenous contrast. Multiplanar CT image reconstructions and MIPs were obtained to evaluate the vascular anatomy. Carotid stenosis measurements (when applicable) are obtained utilizing NASCET criteria, using the distal internal carotid diameter as the denominator. CONTRAST:  49mL OMNIPAQUE IOHEXOL 350 MG/ML SOLN COMPARISON:  Head CT 08/20/2020 FINDINGS: CTA NECK FINDINGS Aortic arch: Standard 3 vessel aortic arch with widely patent arch vessel origins. Right carotid system: Patent with minimal calcified and soft plaque in the carotid bulb. No evidence of stenosis or dissection. Left carotid system: Patent without evidence of stenosis or dissection. Vertebral arteries: Patent with the left being strongly dominant. No evidence of a significant stenosis or  dissection. Skeleton: Mild cervical disc degeneration. Other neck: No evidence of cervical lymphadenopathy or mass. Upper chest: Calcified granuloma in the left upper lobe. Review of the MIP images confirms the above findings CTA HEAD FINDINGS Anterior circulation: The internal carotid arteries are patent from skull base to carotid termini without evidence of a significant stenosis. A medially projecting aneurysm from the left cavernous ICA measures 5 x 4 mm. There is a fusiform aneurysm of  the left ICA terminus which measures 7 mm in diameter. There is a 4 mm left M2 branch vessel aneurysm in the sylvian fissure. A right M1 segment aneurysm measures 5 x 4 mm. ACAs and MCAs are patent with mild diffuse irregularity but no evidence of a proximal branch occlusion or flow limiting proximal stenosis. The right A1 segment is diminutive or absent. Posterior circulation: The intracranial left vertebral artery is widely patent to the basilar. The right vertebral artery is hypoplastic and particularly diminutive distal to the PICA origin. The basilar artery is patent and mildly irregular diffusely without a significant focal stenosis. There are large right and small left posterior communicating arteries with hypoplasia of the right P1 segment. The PCAs are patent and mildly irregular diffusely without a flow limiting proximal stenosis. No aneurysm is identified. Venous sinuses: Patent. Anatomic variants: Fetal right PCA.  Diminutive or absent right A1. Review of the MIP images confirms the above findings IMPRESSION: 1. Intracranial atherosclerosis without large vessel occlusion or flow limiting proximal stenosis. 2. 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 3. 5 mm left cavernous ICA aneurysm. 4. 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 5. Widely patent cervical carotid and vertebral arteries. Electronically Signed   By: Sebastian Ache M.D.   On: 08/20/2020 17:11   MR BRAIN WO CONTRAST  Result Date: 08/20/2020 CLINICAL DATA:  Dizziness, unsteadiness, and headache. EXAM: MRI HEAD WITHOUT CONTRAST TECHNIQUE: Multiplanar, multiecho pulse sequences of the brain and surrounding structures were obtained without intravenous contrast. COMPARISON:  Head CT and CTA 08/20/2020 FINDINGS: Brain: No acute infarct, intracranial hemorrhage, midline shift, or extra-axial fluid collection is identified. A 5 mm T1 hyperintense nodule along the right ventral midbrain corresponds to fat density on CT and is consistent with a  lipoma. T2 hyperintensities in the cerebral white matter bilaterally are nonspecific but compatible with mild-to-moderate chronic small vessel ischemic disease. Multiple small T2 hyperintensities in the midbrain primarily on the right are favored to reflect dilated perivascular spaces although a component of chronic ischemia is possible as well. There are small chronic right cerebellar infarcts. There is mild cerebral atrophy. A partially empty sella is noted. Vascular: Major intracranial vascular flow voids are preserved. Multiple cerebral aneurysms as detailed on today's earlier CTA. 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 Sinuses/Orbits: Unremarkable orbits. Minimal secretions in the right maxillary sinus. Other: None. IMPRESSION: 1. No acute intracranial abnormality. 2. Mild-to-moderate chronic small vessel ischemic disease. 3. Small chronic cerebellar infarcts. 4. 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 Electronically Signed   By: Sebastian Ache M.D.   On: 08/20/2020 18:46    Labs:  CBC: Recent Labs    08/20/20 1148 08/20/20 1206  WBC 5.7  --   HGB 14.9 15.3  HCT 45.9 45.0  PLT 210  --     COAGS: Recent Labs    08/20/20 1148  INR 1.0  APTT 27    BMP: Recent Labs    08/20/20 1148 08/20/20  1206  NA 138 139  K 4.4 4.4  CL 106 109  CO2 23  --   GLUCOSE 153* 145*  BUN 16 20  CALCIUM 9.7  --   CREATININE 1.21 1.20  GFRNONAA >60  --     LIVER FUNCTION TESTS: Recent Labs    08/20/20 1148  BILITOT 0.8  AST 23  ALT 31  ALKPHOS 50  PROT 7.1  ALBUMIN 4.3     Assessment and Plan:  Cerebral arteriograms (left ICA terminus, left ICA cavernous, and right MCA aneurysms). Plan for image-guided diagnostic cerebral arteriogram today in IR with Dr. Corliss Skains. Patient is NPO. Afebrile and WBCs WNL. He does not take blood thinners. INR 1.0 08/20/2020.  Spoke with patient's brother, Ricky Santana, and sister-in-law, Junie Engram, via telephone per patient's request to update on today's planned procedure. All questions answered and concerns addressed.  Risks and benefits of diagnostic cerebral angiogram were discussed with the patient including, but not limited to bleeding, infection, vascular injury, stroke, or contrast induced renal failure. This interventional procedure involves the use of X-rays and because of the nature of the planned procedure, it is possible that we will have prolonged use of X-ray fluoroscopy. Potential radiation risks to you include (but are not limited to) the following: - A slightly elevated risk for cancer  several years later in life. This risk is typically less than 0.5% percent. This risk is low in comparison to the normal incidence of human cancer, which is 33% for women and 50% for men according to the [employer] Society. - Radiation induced injury can include skin redness, resembling a rash, tissue breakdown / ulcers and hair loss (which can be temporary or permanent).  The likelihood of either of these occurring depends on the difficulty of the procedure and whether you are sensitive to radiation due to previous procedures, disease, or genetic conditions.  IF your procedure requires a prolonged use of radiation, you will be notified and given written instructions for further action.  It is your responsibility to monitor the irradiated area for the 2 weeks following the procedure and to notify your physician if you are concerned that you have suffered a radiation induced injury.   All of the patient's questions were answered, patient is agreeable to proceed. Consent signed and in IR control room.   Thank you for this interesting consult.  I greatly enjoyed meeting Craigory Toste and look forward to participating in their care.  A copy of this report was sent to the requesting provider on this date.  Electronically Signed: Elwin Mocha,  PA-C 08/21/2020, 9:09 AM   I spent a total of 40 Minutes in face to face in clinical consultation, greater than 50% of which was counseling/coordinating care for cerebral aneurysms/diagnostic cerebral arteriogram.

## 2020-08-21 NOTE — Evaluation (Signed)
Occupational Therapy Evaluation Patient Details Name: Ricky Santana MRN: [id number] DOB: 1950-11-23 Today's Date: 08/21/2020    History of Present Illness 69 yo male presenting to [facility] on 08/20/20 with dizziness and gait disturbance.  In ED, CTA head/neck revealed incidental findings of three cerebral arteriograms.  Neurology was consulted who recommended NIR consult for possible diagnostic cerebral arteriogram to evaluate cerebral aneurysms. PMH including , migraines, and diabetes mellitus.   Clinical Impression   PTA, pt was living alone and was independent. Pt was visiting his brother in [place] for Christmas and lives in [place]. Pt performing ADLs and functional mobility at Supervision level. Pt reporting he feels "a bit wobbly" but presenting with near baseline function. Pt would benefit from further acute OT to facilitate safe dc. Recommend dc to home once medically stable per physician. All acute OT needs met and will sign off. Please reconsult if status changes. Thank you.  HR 108, SpO2 96% on RA, BP pre-mobility 120/103 (111) and post-mobility 136/95 (108)    Follow Up Recommendations  No OT follow up;Supervision - Intermittent (Initial supervision)    Equipment Recommendations  None recommended by OT    Recommendations for Other Services PT consult     Precautions / Restrictions Precautions Precautions: None Restrictions Weight Bearing Restrictions: No      Mobility Bed Mobility Overal bed mobility: Needs Assistance Bed Mobility: Supine to Sit;Sit to Supine     Supine to sit: Supervision Sit to supine: Supervision   General bed mobility comments: Supervision for safety    Transfers Overall transfer level: Needs assistance Equipment used: None Transfers: Sit to/from Stand Sit to Stand: Supervision         General transfer comment: Supervision  for safety    Balance Overall balance assessment: No apparent balance deficits (not formally assessed)                                          ADL either performed or assessed with clinical judgement   ADL Overall ADL's : Needs assistance/impaired                                       General ADL Comments: Pt performing ADLs and functional mobility at Supervision level. Pt near baseline function.     Vision Baseline Vision/History: Wears glasses Wears Glasses: At all times Patient Visual Report: No change from baseline Additional Comments: Denies any diplopia, blurry vision, or dizziness     Perception     Praxis      Pertinent Vitals/Pain Pain Assessment: No/denies pain     Hand Dominance Right   Extremity/Trunk Assessment Upper Extremity Assessment Upper Extremity Assessment: Overall WFL for tasks assessed (Slight coorindation deficits but very distracted by lines on fingers)   Lower Extremity Assessment Lower Extremity Assessment: Defer to PT evaluation   Cervical / Trunk Assessment Cervical / Trunk Assessment: Normal   Communication Communication Communication: No difficulties   Cognition Arousal/Alertness: Awake/alert Behavior During Therapy: WFL for tasks assessed/performed Overall Cognitive Status: Within Functional Limits for tasks assessed                                     General Comments  HR 108, SpO2 96% on RA,  BP pre-mobility 120/103 (111) and post-mobility 136/95 (108)    Exercises     Shoulder Instructions      Home Living Family/patient expects to be discharged to:: Private residence Living Arrangements: Alone   Type of Home: Other(Comment) ("co-op") Home Access: Level entry     Home Layout: One level     Bathroom Shower/Tub: Teacher, early years/pre: Standard     Home Equipment: None   Additional Comments: This information is for his home; he lives in [place] and is visiting his brother      Prior Functioning/Environment Level of Independence: Independent        Comments: ADLs,  IADLs, drives        OT Problem List: Decreased activity tolerance;Decreased knowledge of precautions;Impaired UE functional use      OT Treatment/Interventions:      OT Goals(Current goals can be found in the care plan section) Acute Rehab OT Goals Patient Stated Goal: Go home OT Goal Formulation: All assessment and education complete, DC therapy  OT Frequency:     Barriers to D/C:            Co-evaluation PT/OT/SLP Co-Evaluation/Treatment: Yes Reason for Co-Treatment: For patient/therapist safety;To address functional/ADL transfers   OT goals addressed during session: ADL's and self-care      AM-PAC OT "6 Clicks" Daily Activity     Outcome Measure Help from another person eating meals?: None Help from another person taking care of personal grooming?: None Help from another person toileting, which includes using toliet, bedpan, or urinal?: A Little Help from another person bathing (including washing, rinsing, drying)?: A Little Help from another person to put on and taking off regular upper body clothing?: None Help from another person to put on and taking off regular lower body clothing?: A Little 6 Click Score: 21   End of Session Equipment Utilized During Treatment: Gait belt Nurse Communication: Mobility status (Elevated BP)  Activity Tolerance: Patient tolerated treatment well Patient left: in bed;with call bell/phone within reach  OT Visit Diagnosis: Unsteadiness on feet (R26.81);Other abnormalities of gait and mobility (R26.89);Muscle weakness (generalized) (M62.81)                Time: 1252-4799 OT Time Calculation (min): 16 min Charges:  OT General Charges $OT Visit: 1 Visit OT Evaluation $OT Eval Low Complexity: 1 Low  Ayana Imhof MSOT, OTR/L Acute Rehab Pager: [phone] Office: [place] 08/21/2020, 10:53 AM

## 2020-08-21 NOTE — Progress Notes (Signed)
Brief Neuro update:  Angiogram tomorrow per Dr. Corliss Skains with Neuro IR. Orders placed and patient is NPO.  Erick Blinks [facility] Pager Number [phone]

## 2020-08-21 NOTE — Progress Notes (Addendum)
Ricky Santana KitchenMarland KitchenMarland Kitchen..............  Subjective: HD#1 Mr. Ricky Santana evaluated at bedside this AM. He denies any current symptoms, changes in speech. He states he worked with PT and it went well, other than a curve he needed to step up on. He denies any dizziness with ambulation.  He recently saw an ophthalmologist and was told everything was good.   Of note: Patient was prepped for angiogram but ED refused to take him back after the procedure. He would need a bed in hospital soon. IR has scheduled him for procedure tomorrow, he will be NPO tonight but the condition would be same tomorrow that he would require bed in hospital.  Objective:  Vital signs in last 24 hours: Vitals:   08/21/20 1045 08/21/20 1115 08/21/20 1230 08/21/20 1245  BP: 120/90 (!) 136/92 (!) 147/103 126/68  Pulse: 79 77 80 79  Resp: 20 19 (!) 22 13  Temp:      TempSrc:      SpO2: 91% 94% 95% 96%  Weight:      Height:       CBC Latest Ref Rng & Units 08/20/2020 08/20/2020  WBC 4.0 - 10.5 K/uL - 5.7  Hemoglobin 13.0 - 17.0 g/dL 84.1 32.4  Hematocrit 40.1 - 52.0 % 45.0 45.9  Platelets 150 - 400 K/uL - 210   BMP Latest Ref Rng & Units 08/20/2020 08/20/2020  Glucose 70 - 99 mg/dL 027(O) 536(U)  BUN 8 - 23 mg/dL 20 16  Creatinine 4.40 - 1.24 mg/dL 3.47 4.25  Sodium 956 - 145 mmol/L 139 138  Potassium 3.5 - 5.1 mmol/L 4.4 4.4  Chloride 98 - 111 mmol/L 109 106  CO2 22 - 32 mmol/L - 23  Calcium 8.9 - 10.3 mg/dL - 9.7   Examination: GENERAL: Well developed, well nourished male lying comfortably in bed, NAD  HEENT: [place]/AT,  No conjunctival injection. Nares patent.  CARDIAC: RRR, S1, S2 heard, no m/r/g PULMONARY: breathing comfortably on room air, lung clear ABDOMEN: soft. Nontender to palpation.  Nondistended.  NEURO: AAO*3, Normal speech, no aphasia. CN II-XII intact except few beats of nystagmus with extreme gaze laterally. L>R, non-sustained. . Sensation intact. 5/5 strength in upper and lower extremities.  MSK: normal muscle  tone  Assessment/Plan: 69 yom with hypertension, diabetes, and complicated migraines admitted to [facility] on 12/26 for a stroke workup after an acute onset gait unsteadiness associated with dizziness and mild headache. Stroke workup has been negative for acute ischemia however imaging has revealed multiple small aneurysms and a cerebellar infarct, possibly remote. Active Problems:   Acute ataxia  # Dizziness  # Right cerebellar infarct  Denies any dizziness today while sitting or ambulating. Stroke work up is being done- MRI completed, Echocardiogram still pending. On evaluation, PT suggests pt needs supervision transfers and supervision ambulation.   - Aspirin 81 mg - Atorvastatin  - Appreciate neurology recommendations - F/U echocardiogram - Appreciate PT assistance - q2h neurochecks - normotensive blood pressure goal given intracranial aneurysms - F/U TSH and RPR  - Tele monitoring  # Multiple Intracranial Aneurysms  Patient has Hx of complicated migraines with sensory disturbance. Pt notes that his current sx are not similar to his typical sx. CT head without contrast: multiple small intracranial aneurysms including 87mm aneurysm in the region of left ICA terminus. No evidence of ICH. 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 Chronic microvascular disease. CTA head/neck: 51mm left ICA terminus fusiform aneurysm, 44mm left cavernous ICA aneurysm, 37mm left and 76mm right MCA aneurysms. MRI brain confirms the  findings. Patient was prepped for angiogram but ED refused to take him back after the procedure. He would need a bed in hospital soon. IR has scheduled him for procedure tomorrow, he will be NPO tonight but the condition would be same tomorrow that he would require bed in hospital.    - F/u Angiogram  - blood pressure goal is normotension - continue to monitor for recurrent headache/neck pain, neuro changes--page neurology stat if this occurs as it could indicate a bleed    # Chronic  hypertension. Neuro suggests tight control of BP. Hypertensive at times today.  -continue losartan 100 mg.  -blood pressure goal is normotension.    # Type 2 DM. On metformin and jardiance at home -continue home dose of metformin -moderate SSI -check A1C  Prior to Admission Living Arrangement: Home Anticipated Discharge Location: Home Barriers to Discharge: Ongoing management Dispo: Anticipated discharge in approximately 3-4 day(s).   Arnoldo Lenis, MD 08/21/2020, 1:48 PM Pager: [phone] After 5pm on weekdays and 1pm on weekends: On Call pager [phone]

## 2020-08-21 NOTE — Progress Notes (Signed)
Echocardiogram 2D Echocardiogram has been performed.  Ricky Santana Ricky Santana 08/21/2020, 11:51 AM

## 2020-08-21 NOTE — Progress Notes (Signed)
STROKE TEAM PROGRESS NOTE   INTERVAL HISTORY No family at the bedside. Pt lying in bed, no complains, denies any HA, neck pain. Did well with PT/OT, no recommendations. Plan for angiogram today, however, no bed assigned for admission so the procedure postponed to tomorrow.   Vitals:   08/21/20 1230 08/21/20 1245 08/21/20 1301 08/21/20 1437  BP: (!) 147/103 126/68 (!) 151/101 (!) 142/85  Pulse: 80 79 88 81  Resp: (!) 22 13 17 16   Temp:      TempSrc:      SpO2: 95% 96% 97% 96%  Weight:      Height:       CBC:  Recent Labs  Lab 08/20/20 1148 08/20/20 1206  WBC 5.7  --   NEUTROABS 3.2  --   HGB 14.9 15.3  HCT 45.9 45.0  MCV 95.2  --   PLT 210  --    Basic Metabolic Panel:  Recent Labs  Lab 08/20/20 1148 08/20/20 1206  NA 138 139  K 4.4 4.4  CL 106 109  CO2 23  --   GLUCOSE 153* 145*  BUN 16 20  CREATININE 1.21 1.20  CALCIUM 9.7  --    Lipid Panel: No results for input(s): CHOL, TRIG, HDL, CHOLHDL, VLDL, LDLCALC in the last 168 hours. HgbA1c: No results for input(s): HGBA1C in the last 168 hours. Urine Drug Screen: No results for input(s): LABOPIA, COCAINSCRNUR, LABBENZ, AMPHETMU, THCU, LABBARB in the last 168 hours.  Alcohol Level No results for input(s): ETH in the last 168 hours.  IMAGING past 24 hours CT Angio Head W/Cm &/Or Wo Cm  Result Date: 08/20/2020 CLINICAL DATA:  Dizziness, unsteadiness, and headache. EXAM: CT ANGIOGRAPHY HEAD AND NECK TECHNIQUE: Multidetector CT imaging of the head and neck was performed using the standard protocol during bolus administration of intravenous contrast. Multiplanar CT image reconstructions and MIPs were obtained to evaluate the vascular anatomy. Carotid stenosis measurements (when applicable) are obtained utilizing NASCET criteria, using the distal internal carotid diameter as the denominator. CONTRAST:  66mL OMNIPAQUE IOHEXOL 350 MG/ML SOLN COMPARISON:  Head CT 08/20/2020 FINDINGS: CTA NECK FINDINGS Aortic arch: Standard 3  vessel aortic arch with widely patent arch vessel origins. Right carotid system: Patent with minimal calcified and soft plaque in the carotid bulb. No evidence of stenosis or dissection. Left carotid system: Patent without evidence of stenosis or dissection. Vertebral arteries: Patent with the left being strongly dominant. No evidence of a significant stenosis or dissection. Skeleton: Mild cervical disc degeneration. Other neck: No evidence of cervical lymphadenopathy or mass. Upper chest: Calcified granuloma in the left upper lobe. Review of the MIP images confirms the above findings CTA HEAD FINDINGS Anterior circulation: The internal carotid arteries are patent from skull base to carotid termini without evidence of a significant stenosis. A medially projecting aneurysm from the left cavernous ICA measures 5 x 4 mm. There is a fusiform aneurysm of the left ICA terminus which measures 7 mm in diameter. There is a 4 mm left M2 branch vessel aneurysm in the sylvian fissure. A right M1 segment aneurysm measures 5 x 4 mm. ACAs and MCAs are patent with mild diffuse irregularity but no evidence of a proximal branch occlusion or flow limiting proximal stenosis. The right A1 segment is diminutive or absent. Posterior circulation: The intracranial left vertebral artery is widely patent to the basilar. The right vertebral artery is hypoplastic and particularly diminutive distal to the PICA origin. The basilar artery is patent and mildly irregular  diffusely without a significant focal stenosis. There are large right and small left posterior communicating arteries with hypoplasia of the right P1 segment. The PCAs are patent and mildly irregular diffusely without a flow limiting proximal stenosis. No aneurysm is identified. Venous sinuses: Patent. Anatomic variants: Fetal right PCA.  Diminutive or absent right A1. Review of the MIP images confirms the above findings IMPRESSION: 1. Intracranial atherosclerosis without large  vessel occlusion or flow limiting proximal stenosis. 2. 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 3. 5 mm left cavernous ICA aneurysm. 4. 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 5. Widely patent cervical carotid and vertebral arteries. Electronically Signed   By: Sebastian Ache M.D.   On: 08/20/2020 17:11   CT Angio Neck W and/or Wo Contrast  Result Date: 08/20/2020 CLINICAL DATA:  Dizziness, unsteadiness, and headache. EXAM: CT ANGIOGRAPHY HEAD AND NECK TECHNIQUE: Multidetector CT imaging of the head and neck was performed using the standard protocol during bolus administration of intravenous contrast. Multiplanar CT image reconstructions and MIPs were obtained to evaluate the vascular anatomy. Carotid stenosis measurements (when applicable) are obtained utilizing NASCET criteria, using the distal internal carotid diameter as the denominator. CONTRAST:  32mL OMNIPAQUE IOHEXOL 350 MG/ML SOLN COMPARISON:  Head CT 08/20/2020 FINDINGS: CTA NECK FINDINGS Aortic arch: Standard 3 vessel aortic arch with widely patent arch vessel origins. Right carotid system: Patent with minimal calcified and soft plaque in the carotid bulb. No evidence of stenosis or dissection. Left carotid system: Patent without evidence of stenosis or dissection. Vertebral arteries: Patent with the left being strongly dominant. No evidence of a significant stenosis or dissection. Skeleton: Mild cervical disc degeneration. Other neck: No evidence of cervical lymphadenopathy or mass. Upper chest: Calcified granuloma in the left upper lobe. Review of the MIP images confirms the above findings CTA HEAD FINDINGS Anterior circulation: The internal carotid arteries are patent from skull base to carotid termini without evidence of a significant stenosis. A medially projecting aneurysm from the left cavernous ICA measures 5 x 4 mm. There is a fusiform aneurysm of the left ICA terminus which measures 7 mm in diameter. There is a 4 mm left M2 branch vessel  aneurysm in the sylvian fissure. A right M1 segment aneurysm measures 5 x 4 mm. ACAs and MCAs are patent with mild diffuse irregularity but no evidence of a proximal branch occlusion or flow limiting proximal stenosis. The right A1 segment is diminutive or absent. Posterior circulation: The intracranial left vertebral artery is widely patent to the basilar. The right vertebral artery is hypoplastic and particularly diminutive distal to the PICA origin. The basilar artery is patent and mildly irregular diffusely without a significant focal stenosis. There are large right and small left posterior communicating arteries with hypoplasia of the right P1 segment. The PCAs are patent and mildly irregular diffusely without a flow limiting proximal stenosis. No aneurysm is identified. Venous sinuses: Patent. Anatomic variants: Fetal right PCA.  Diminutive or absent right A1. Review of the MIP images confirms the above findings IMPRESSION: 1. Intracranial atherosclerosis without large vessel occlusion or flow limiting proximal stenosis. 2. 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 3. 5 mm left cavernous ICA aneurysm. 4. 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 5. Widely patent cervical carotid and vertebral arteries. Electronically Signed   By: Sebastian Ache M.D.   On: 08/20/2020 17:11   MR BRAIN WO CONTRAST  Result Date: 08/20/2020 CLINICAL DATA:  Dizziness, unsteadiness, and headache. EXAM: MRI HEAD  WITHOUT CONTRAST TECHNIQUE: Multiplanar, multiecho pulse sequences of the brain and surrounding structures were obtained without intravenous contrast. COMPARISON:  Head CT and CTA 08/20/2020 FINDINGS: Brain: No acute infarct, intracranial hemorrhage, midline shift, or extra-axial fluid collection is identified. A 5 mm T1 hyperintense nodule along the right ventral midbrain corresponds to fat density on CT and is consistent with a lipoma. T2 hyperintensities in the cerebral white matter bilaterally are nonspecific but  compatible with mild-to-moderate chronic small vessel ischemic disease. Multiple small T2 hyperintensities in the midbrain primarily on the right are favored to reflect dilated perivascular spaces although a component of chronic ischemia is possible as well. There are small chronic right cerebellar infarcts. There is mild cerebral atrophy. A partially empty sella is noted. Vascular: Major intracranial vascular flow voids are preserved. Multiple cerebral aneurysms as detailed on today's earlier CTA. 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 Sinuses/Orbits: Unremarkable orbits. Minimal secretions in the right maxillary sinus. Other: None. IMPRESSION: 1. No acute intracranial abnormality. 2. Mild-to-moderate chronic small vessel ischemic disease. 3. Small chronic cerebellar infarcts. 4. 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 Electronically Signed   By: Sebastian AcheAllen  Grady M.D.   On: 08/20/2020 18:46    PHYSICAL EXAM  Temp:  [98 F (36.7 C)] 98 F (36.7 C) (12/27 0435) Pulse Rate:  [60-104] 70 (12/27 1600) Resp:  [13-25] 16 (12/27 1600) BP: (108-157)/(68-112) 134/81 (12/27 1600) SpO2:  [89 %-100 %] 94 % (12/27 1600)  General - Well nourished, well developed, in no apparent distress.  Ophthalmologic - fundi not visualized due to noncooperation.  Cardiovascular - Regular rhythm and rate.  Mental Status -  Level of arousal and orientation to time, place, and person were intact. Language including expression, naming, repetition, comprehension was assessed and found intact. Fund of Knowledge was assessed and was intact.  Cranial Nerves II - XII - II - Visual field intact OU. III, IV, VI - Extraocular movements intact. V - Facial sensation intact bilaterally. VII - Facial movement intact bilaterally. VIII - Hearing & vestibular intact bilaterally. X - Palate elevates symmetrically. XI - Chin turning & shoulder shrug  intact bilaterally. XII - Tongue protrusion intact.  Motor Strength - The patient's strength was normal in all extremities and pronator drift was absent.  Bulk was normal and fasciculations were absent.   Motor Tone - Muscle tone was assessed at the neck and appendages and was normal.  Reflexes - The patient's reflexes were symmetrical in all extremities and he had no pathological reflexes. No nuchal rigidity  Sensory - Light touch, temperature/pinprick were assessed and were symmetrical.    Coordination - The patient had normal movements in the hands with no ataxia or dysmetria.  Tremor was absent.  Gait and Station - deferred.   ASSESSMENT/PLAN Mr. Ricky Santana is a 69 y.o. male with history of HTN, DM2, childhood-onset complicated migraines (sensory symptoms) presenting with dizziness w unsteady gait, mild bifrontal HA.  ? sentinel headache due to multiple Intracranial Aneurysms  CT head multiple intracranial aneurysms (7mm L ICA terminus). Old R cerebellar lacune. R midbrain lipoma. Small vessel disease.   CTA head & neck intracranial atherosclerosis. Aneurysms:  7mm L ICA terminus, 5mm L cavernous ICA, 4mm L MCA, 5mm R MCA  MRI  No acute abnormality. Moderate to moderate small vessel disease. Small chronic cerebellar infarcts. R ventral midbrain lipoma.  2D Echo EF 55-60%. No source of embolus. LA mildly dilated.  Cerebral angio 08/22/2020 w/ Dr. Corliss Skains  LDL pending   HgbA1c pending   TSH/RPR/HIV pending  VTE prophylaxis - SCDs   aspirin 81 mg daily prior to admission, now on ASA 81.   Therapy recommendations:  No PT, no OT  Disposition: pending  Hypertension  Stable but DBP on higher end  On losartan 100  BP goal < 160 . BP goal normotensive  Hyperlipidemia  Home meds:  Lipitor 40, resumed in hospital  LDL pending, goal < 70  Continue statin at discharge  Diabetes type II Controlled  HgbA1c pending, goal < 7.0  CBGs  SSI  PCP follow up    Other Stroke Risk Factors  Advanced Age >/= 70   Obesity, Body mass index is 30.41 kg/m., BMI >/= 30 associated with increased stroke risk, recommend weight loss, diet and exercise as appropriate   Migraines -  childhood-onset complicated migraines (sensory symptoms) - not felt to be related to current presentation  Other Active Problems    Hospital day # 1  Marvel Plan, MD PhD Stroke Neurology 08/21/2020 4:54 PM   To contact Stroke Continuity provider, please refer to [email or web address]. After hours, contact General Neurology

## 2020-08-22 ENCOUNTER — Inpatient Hospital Stay (HOSPITAL_COMMUNITY): Payer: Medicare (Managed Care)

## 2020-08-22 DIAGNOSIS — I10 Essential (primary) hypertension: Secondary | ICD-10-CM

## 2020-08-22 DIAGNOSIS — I639 Cerebral infarction, unspecified: Secondary | ICD-10-CM

## 2020-08-22 DIAGNOSIS — E119 Type 2 diabetes mellitus without complications: Secondary | ICD-10-CM

## 2020-08-22 HISTORY — PX: IR ANGIO VERTEBRAL SEL SUBCLAVIAN INNOMINATE BILAT MOD SED: IMG5366

## 2020-08-22 HISTORY — PX: IR ANGIO INTRA EXTRACRAN SEL COM CAROTID INNOMINATE BILAT MOD SED: IMG5360

## 2020-08-22 HISTORY — PX: IR US GUIDE VASC ACCESS RIGHT: IMG2390

## 2020-08-22 HISTORY — PX: IR 3D INDEPENDENT WKST: IMG2385

## 2020-08-22 HISTORY — PX: IR ANGIO VERTEBRAL SEL VERTEBRAL BILAT MOD SED: IMG5369

## 2020-08-22 LAB — BASIC METABOLIC PANEL
Anion gap: 11 (ref 5–15)
BUN: 20 mg/dL (ref 8–23)
CO2: 23 mmol/L (ref 22–32)
Calcium: 9.3 mg/dL (ref 8.9–10.3)
Chloride: 106 mmol/L (ref 98–111)
Creatinine, Ser: 1.27 mg/dL — ABNORMAL HIGH (ref 0.61–1.24)
GFR, Estimated: >60 mL/min (ref >60)
Glucose, Bld: 127 mg/dL — ABNORMAL HIGH (ref 70–99)
Potassium: 3.8 mmol/L (ref 3.5–5.1)
Sodium: 140 mmol/L (ref 135–145)

## 2020-08-22 LAB — GLUCOSE, CAPILLARY
Glucose-Capillary: 106 mg/dL — ABNORMAL HIGH (ref 70–99)
Glucose-Capillary: 146 mg/dL — ABNORMAL HIGH (ref 70–99)
Glucose-Capillary: 136 mg/dL — ABNORMAL HIGH (ref 70–99)

## 2020-08-22 LAB — RAPID HIV SCREEN (HIV 1/2 AB+AG)
HIV 1/2 Antibodies: NONREACTIVE
HIV-1 P24 Antigen - HIV24: NONREACTIVE

## 2020-08-22 LAB — LIPID PANEL
Cholesterol: 111 mg/dL (ref 0–200)
HDL: 47 mg/dL (ref >40)
LDL Cholesterol: 49 mg/dL (ref 0–99)
Total CHOL/HDL Ratio: 2.4 RATIO
Triglycerides: 73 mg/dL (ref <150)
VLDL: 15 mg/dL (ref 0–40)

## 2020-08-22 LAB — CBC
HCT: 44.4 % (ref 39.0–52.0)
Hemoglobin: 14.5 g/dL (ref 13.0–17.0)
MCH: 30.5 pg (ref 26.0–34.0)
MCHC: 32.7 g/dL (ref 30.0–36.0)
MCV: 93.5 fL (ref 80.0–100.0)
Platelets: 199 10*3/uL (ref 150–400)
RBC: 4.75 MIL/uL (ref 4.22–5.81)
RDW: 12.9 % (ref 11.5–15.5)
WBC: 7.9 10*3/uL (ref 4.0–10.5)
nRBC: 0 % (ref 0.0–0.2)

## 2020-08-22 LAB — RPR: RPR Ser Ql: NONREACTIVE

## 2020-08-22 LAB — HEMOGLOBIN A1C
Hgb A1c MFr Bld: 6.9 % — ABNORMAL HIGH (ref 4.8–5.6)
Mean Plasma Glucose: 151.33 mg/dL

## 2020-08-22 LAB — TSH: TSH: 2.985 u[IU]/mL (ref 0.350–4.500)

## 2020-08-22 MED ORDER — FENTANYL CITRATE (PF) 100 MCG/2ML IJ SOLN
INTRAMUSCULAR | Status: AC
  Filled 2020-08-22: qty 2

## 2020-08-22 MED ORDER — SODIUM CHLORIDE 0.9 % IV SOLN
INTRAVENOUS | Status: AC | PRN
  Administered 2020-08-22: 250 mL via INTRAVENOUS

## 2020-08-22 MED ORDER — IOHEXOL 300 MG/ML  SOLN
100.0000 mL | Freq: Once | INTRAMUSCULAR | Status: AC | PRN
  Administered 2020-08-22: 40 mL via INTRA_ARTERIAL

## 2020-08-22 MED ORDER — MIDAZOLAM HCL 2 MG/2ML IJ SOLN
INTRAMUSCULAR | Status: AC | PRN
  Administered 2020-08-22: 1 mg via INTRAVENOUS

## 2020-08-22 MED ORDER — VERAPAMIL HCL 2.5 MG/ML IV SOLN: INTRA_ARTERIAL | Status: AC | PRN

## 2020-08-22 MED ORDER — NITROGLYCERIN 1 MG/10 ML FOR IR/CATH LAB
INTRA_ARTERIAL | Status: AC
  Filled 2020-08-22: qty 10

## 2020-08-22 MED ORDER — HEPARIN SODIUM (PORCINE) 1000 UNIT/ML IJ SOLN
INTRAMUSCULAR | Status: AC
  Filled 2020-08-22: qty 1

## 2020-08-22 MED ORDER — VERAPAMIL HCL 2.5 MG/ML IV SOLN
INTRAVENOUS | Status: AC
  Filled 2020-08-22: qty 2

## 2020-08-22 MED ORDER — IOHEXOL 300 MG/ML  SOLN
150.0000 mL | Freq: Once | INTRAMUSCULAR | Status: AC | PRN
  Administered 2020-08-22: 60 mL via INTRA_ARTERIAL

## 2020-08-22 MED ORDER — FENTANYL CITRATE (PF) 100 MCG/2ML IJ SOLN
INTRAMUSCULAR | Status: AC | PRN
  Administered 2020-08-22: 25 ug via INTRAVENOUS

## 2020-08-22 MED ORDER — SODIUM CHLORIDE (PF) 0.9 % IJ SOLN
INTRAVENOUS | Status: AC | PRN
  Administered 2020-08-22: 200 ug via INTRA_ARTERIAL

## 2020-08-22 MED ORDER — SODIUM CHLORIDE 0.9 % IV SOLN: INTRAVENOUS | Status: AC

## 2020-08-22 MED ORDER — MIDAZOLAM HCL 2 MG/2ML IJ SOLN
INTRAMUSCULAR | Status: AC
  Filled 2020-08-22: qty 2

## 2020-08-22 MED ORDER — LIDOCAINE HCL 1 % IJ SOLN
INTRAMUSCULAR | Status: AC
  Filled 2020-08-22: qty 20

## 2020-08-22 NOTE — ED Notes (Signed)
Report given to floor, IR stated they will come get him in 15 minutes and take him to floor.

## 2020-08-22 NOTE — Progress Notes (Signed)
STROKE TEAM PROGRESS NOTE   INTERVAL HISTORY No family at the bedside. Pt lying in bed, no complains, denies any HA, neck pain. Just had angiogram today with Dr. Corliss Skains and confirmed 4 aneurysms with various size. Plan to do the embolization on OP basis next week.    Vitals:   08/22/20 1025 08/22/20 1030 08/22/20 1040 08/22/20 1120  BP: 111/79 111/84 120/84 (!) 128/92  Pulse: 84 80 76 (!) 105  Resp:    18  Temp:    98.3 F (36.8 C)  TempSrc:    Oral  SpO2: 98% 97% 96% 95%  Weight:      Height:       CBC:  Recent Labs  Lab 08/20/20 1148 08/20/20 1206 08/22/20 0559  WBC 5.7  --  7.9  NEUTROABS 3.2  --   --   HGB 14.9 15.3 14.5  HCT 45.9 45.0 44.4  MCV 95.2  --  93.5  PLT 210  --  199   Basic Metabolic Panel:  Recent Labs  Lab 08/20/20 1148 08/20/20 1206 08/22/20 0559  NA 138 139 140  K 4.4 4.4 3.8  CL 106 109 106  CO2 23  --  23  GLUCOSE 153* 145* 127*  BUN 16 20 20   CREATININE 1.21 1.20 1.27*  CALCIUM 9.7  --  9.3   Lipid Panel:  Recent Labs  Lab 08/22/20 0600  CHOL 111  TRIG 73  HDL 47  CHOLHDL 2.4  VLDL 15  LDLCALC 49   HgbA1c:  Recent Labs  Lab 08/22/20 0600  HGBA1C 6.9*   Urine Drug Screen: No results for input(s): LABOPIA, COCAINSCRNUR, LABBENZ, AMPHETMU, THCU, LABBARB in the last 168 hours.  Alcohol Level No results for input(s): ETH in the last 168 hours.  IMAGING past 24 hours No results found.  PHYSICAL EXAM  Temp:  [98 F (36.7 C)-98.3 F (36.8 C)] 98.3 F (36.8 C) (12/28 1120) Pulse Rate:  [64-105] 105 (12/28 1120) Resp:  [5-24] 18 (12/28 1120) BP: (102-152)/(72-104) 128/92 (12/28 1120) SpO2:  [61 %-100 %] 95 % (12/28 1120)  General - Well nourished, well developed, in no apparent distress.  Ophthalmologic - fundi not visualized due to noncooperation.  Cardiovascular - Regular rhythm and rate.  Mental Status -  Level of arousal and orientation to time, place, and person were intact. Language including expression,  naming, repetition, comprehension was assessed and found intact. Fund of Knowledge was assessed and was intact.  Cranial Nerves II - XII - II - Visual field intact OU. III, IV, VI - Extraocular movements intact. V - Facial sensation intact bilaterally. VII - Facial movement intact bilaterally. VIII - Hearing & vestibular intact bilaterally. X - Palate elevates symmetrically. XI - Chin turning & shoulder shrug intact bilaterally. XII - Tongue protrusion intact.  Motor Strength - The patient's strength was normal in all extremities and pronator drift was absent.  Bulk was normal and fasciculations were absent.   Motor Tone - Muscle tone was assessed at the neck and appendages and was normal.  Reflexes - The patient's reflexes were symmetrical in all extremities and he had no pathological reflexes. No nuchal rigidity  Sensory - Light touch, temperature/pinprick were assessed and were symmetrical.    Coordination - The patient had normal movements in the hands with no ataxia or dysmetria.  Tremor was absent.  Gait and Station - deferred.   ASSESSMENT/PLAN Mr. Carnel Stegman is a 69 y.o. male with history of HTN, DM2, childhood-onset complicated migraines (  sensory symptoms) presenting with dizziness w unsteady gait, mild bifrontal HA.  ? sentinel headache due to multiple Intracranial Aneurysms  CT head multiple intracranial aneurysms (47mm L ICA terminus). Old R cerebellar lacune. R midbrain lipoma. Small vessel disease.   CTA head & neck intracranial atherosclerosis. Aneurysms:  4mm L ICA terminus, 71mm L cavernous ICA, 34mm L MCA, 3mm R MCA  MRI  No acute abnormality. Moderate to moderate small vessel disease. Small chronic cerebellar infarcts. R ventral midbrain lipoma.  2D Echo EF 55-60%. No source of embolus. LA mildly dilated.  Cerebral angio showed left ICA cavernous 5x5, tICA 10x7, left MCA 4x3 and right MCA 5x5 aneurysms   Plan for aneurysm coiling next week as OP  LDL  49  HgbA1c 6.9   TSH/RPR/HIV neg  VTE prophylaxis - SCDs   aspirin 81 mg daily prior to admission, now on ASA 81. Continue on discharge and follow the antiplatelet regimen from Dr. Corliss Skains for near future coiling  Therapy recommendations:  No PT, no OT  Disposition: home today  Hypertension  Stable   On losartan 100  BP goal < 160 . BP goal normotensive  Hyperlipidemia  Home meds:  Lipitor 40, resumed in hospital  LDL 49, goal < 70  Continue statin at discharge  Diabetes type II Controlled  HgbA1c 6.9, goal < 7.0  CBGs  SSI  PCP follow up   Other Stroke Risk Factors  Advanced Age >/= 71   Obesity, Body mass index is 30.41 kg/m., BMI >/= 30 associated with increased stroke risk, recommend weight loss, diet and exercise as appropriate   Migraines -  childhood-onset complicated migraines (sensory symptoms) - not felt to be related to current presentation  Other Active Problems  Lives in [place] - but OK to have procedure done here  Hospital day # 2  Neurology will sign off. Please call with questions. No neuro follow up needed at this time. Thanks for the consult.  Marvel Plan, MD PhD Stroke Neurology 08/22/2020 1:19 PM   To contact Stroke Continuity provider, please refer to [email or web address]. After hours, contact General Neurology

## 2020-08-22 NOTE — ED Notes (Signed)
Patient O2 sats dropping how sleeping. Patient stated he have sleep apnea and however do not use a CPAP at home. No sign of respiratory distress noted. Patient placed on 2L via [place] and O2 sats 97%

## 2020-08-22 NOTE — Discharge Summary (Addendum)
Name: Ricky Santana MRN: [id number] DOB: Jan 13, 1951 69 y.o. PCP: Ricky Santana in [place], [place]  Date of Admission: 08/20/2020 11:28 AM Date of Discharge: 08/23/20 Attending Physician: Ricky Shutter, MD  Discharge Diagnosis: 1. Multiple intracranial aneurysms 2. 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 Essential HTN 4. Type 2 Diabetes   Discharge Medications: Allergies as of 08/23/2020   No Known Allergies     Medication List    TAKE these medications   aspirin EC 81 MG tablet Take 1 tablet (81 mg total) by mouth daily. Swallow whole.   atorvastatin 40 MG tablet Commonly known as: LIPITOR Take 1 tablet (40 mg total) by mouth daily.   Januvia 100 MG tablet Generic drug: sitaGLIPtin Take 1 tablet (100 mg total) by mouth daily.   losartan 100 MG tablet Commonly known as: COZAAR Take 1 tablet (100 mg total) by mouth daily.   metFORMIN 1000 MG tablet Commonly known as: GLUCOPHAGE Take 1 tablet (1,000 mg total) by mouth 2 (two) times daily.   TRIPLE FLEX PO Take 2 tablets by mouth daily.            Discharge Care Instructions  (From admission, onward)         Start     Ordered   08/23/20 0000  Discharge wound care:       Comments: Change bandage daily   08/23/20 1619          Disposition and follow-up:   Ricky Santana was discharged from [facility] in Good condition.  At the hospital follow up visit please address:  1.  Follow up:     A. Multiple intracranial aneurysms - f/u with your PCP Ricky Santana to arrange referral to neuroradiologist for treatment, we did not start DAPT yet     B. Subacute to chronic CVA - stroke workup completed and otherwise benign     C. HTN - normotensive goal is especially important with his aneurysms, was well controlled on  losartan     D. DM - A1c 6.9 on admission, discharging without any changes to his regimen     E.  Elevated creatinine -most likely due to dye load from angiogram on previous  day, encouraged p.o. intake, follow-up creatinine level  2.  Labs / imaging needed at time of follow-up: BMP, sending CD of his imaging here, so these may not need to be repeated  3.  Pending labs/ test needing follow-up: none  Follow-up Appointments:   Hospital Course by problem list: 1. Non-acute age-indeterminate R cerebellar infarct Patient presented with complaints of ataxia, dizziness, and headache. CT head with  multiple intracranial aneurysms, remote appearing small lacunar infarct in the right cerebellum, and chronic microvascular ischemic disease. CTA redemonstrates aneurysms. MRI remonstrates chronic changes. On angiogram he had 4 aneurysms, the largest measuring 10.43mm x 6.44mm. TEE without shunt. Lipid panel benign. RPR and HIV negative. A1c 6.9. TSH negative. OT and PT without further recommendations. Symptoms had resolved by hospital day 2, and the only abnormal finding on neuro exam was mild nystagmus which also resolved. He was continued on ASA , atorvastatin , and losartan . BP goal of normotensive given the aneurysms.  2. Multiple intracranial aneurysms As noted above and in the pertinent studies section below, 4 aneurysms on cerebral angiogram in multiple distributions, largest measuring 10.3x6.26mm. Our IR team was consulted and plan for outpatient procedure.  Unfortunately his [place] plan [place].  Discussed with his PCP Dr.  Byrns [phone]) who is comfortable seeing the patient on follow-up and referring him for embolization.  Will fax this patient's medical records to his PCP, and will send the patient with a disc of his images.  Our IR team recommended dual antiplatelet therapy for 7 days prior to embolization, but as he is going to be having this procedure at a different facility, we will not discharge this patient with this.  3.  Hypertension Blood pressure elevated on admission.  Unclear why as he denied missing any  medications.  We restarted his losartan, and his blood pressure was very well controlled on this.   Discharge Vitals:   BP (!) 123/91 (BP Location: Left Arm)   Pulse 98   Temp 97.6 F (36.4 C) (Oral)   Resp 16   Ht 5\' 8"  (1.727 m)   Wt 90.7 kg   SpO2 96%   BMI 30.41 kg/m   Pertinent Labs, Studies, and Procedures:  4 Vessel Cerebral Arteriogram RT rad approach. Findings. 1.Approx 5.691mm x 5.831mm Ly ICA cavernous carotid aneyrysm 2.Approx 10.763mmx 6.8 mm Lt ICA terminus fusiform aneurysm. 3.Approx 4.691mm x 3.3 mm Lt MCA wide neck bifurcation aneurysm. 4 .Approx 5.4 x 5.3 mm  Wide neck RT MCA aneurysm at origin of anterior temporal branch. S.Deveshwar MD   CT Angio Head W/Cm &/Or Wo Cm Result Date: 08/20/2020  FINDINGS Anterior circulation: The internal carotid arteries are patent from skull base to carotid termini without evidence of a significant stenosis. A medially projecting aneurysm from the left cavernous ICA measures 5 x 4 mm. There is a fusiform aneurysm of the left ICA terminus which measures 7 mm in diameter. There is a 4 mm left M2 branch vessel aneurysm in the sylvian fissure. A right M1 segment aneurysm measures 5 x 4 mm. ACAs and MCAs are patent with mild diffuse irregularity but no evidence of a proximal branch occlusion or flow limiting proximal stenosis. The right A1 segment is diminutive or absent. Posterior circulation: The intracranial left vertebral artery is widely patent to the basilar. The right vertebral artery is hypoplastic and particularly diminutive distal to the PICA origin. The basilar artery is patent and mildly irregular diffusely without a significant focal stenosis. There are large right and small left posterior communicating arteries with hypoplasia of the right P1 segment. The PCAs are patent and mildly irregular diffusely without a flow limiting proximal stenosis. No aneurysm is identified. Venous sinuses: Patent. Anatomic variants: Fetal right PCA.   Diminutive or absent right A1. Review of the MIP images confirms the above findings   IMPRESSION: 1. Intracranial atherosclerosis without large vessel occlusion or flow limiting proximal stenosis. 2. 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 3. 5 mm left cavernous ICA aneurysm. 4. 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 5. Widely patent cervical carotid and vertebral arteries. Electronically Signed   By: Sebastian AcheAllen  Grady M.D.   On: 08/20/2020 17:11   CT HEAD WO CONTRAST Result Date: 08/20/2020   FINDINGS: Brain: No evidence of acute large vascular territory infarction, hemorrhage, hydrocephalus, extra-axial collection. Small remote appearing lacunar infarct in the right cerebellum. Patchy white matter hypoattenuation, nonspecific but most likely related to chronic microvascular ischemic disease. Partially empty sella. Vascular: There are multiple suspected aneurysms, including apparent aneurysms in the region left carotid terminus and bilateral MCAs. The left ICA terminus aneurysm measures at least 7 mm. Skull: No acute fracture. Sinuses/Orbits: Small amount of frothy secretions in the right maxillary sinus. Unremarkable orbits. Other: No mastoid  effusions.   IMPRESSION: 1. 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 These are suboptimally evaluated on this noncontrast CT. Recommend CTA of the head to further evaluate. No evidence of acute hemorrhage. 2. 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 3. 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 Intracranial dermoid is a differential consideration. Nonurgent MRI with contrast could further evaluate if clinically indicated. 4. Chronic microvascular ischemic disease. Findings and recommendations discussed with Dr. Charm Barges via telephone at 12:56 p.m. Electronically Signed   By: Feliberto Harts MD   On: 08/20/2020 13:01   CT Angio Neck W  and/or Wo Contrast Result Date: 08/20/2020  FINDINGS: CTA NECK FINDINGS Aortic arch: Standard 3 vessel aortic arch with widely patent arch vessel origins. Right carotid system: Patent with minimal calcified and soft plaque in the carotid bulb. No evidence of stenosis or dissection. Left carotid system: Patent without evidence of stenosis or dissection. Vertebral arteries: Patent with the left being strongly dominant. No evidence of a significant stenosis or dissection. Skeleton: Mild cervical disc degeneration. Other neck: No evidence of cervical lymphadenopathy or mass. Upper chest: Calcified granuloma in the left upper lobe. Review of the MIP images confirms the above findings CTA HEAD FINDINGS Anterior circulation: The internal carotid arteries are patent from skull base to carotid termini without evidence of a significant stenosis. A medially projecting aneurysm from the left cavernous ICA measures 5 x 4 mm. There is a fusiform aneurysm of the left ICA terminus which measures 7 mm in diameter. There is a 4 mm left M2 branch vessel aneurysm in the sylvian fissure. A right M1 segment aneurysm measures 5 x 4 mm. ACAs and MCAs are patent with mild diffuse irregularity but no evidence of a proximal branch occlusion or flow limiting proximal stenosis. The right A1 segment is diminutive or absent. Posterior circulation: The intracranial left vertebral artery is widely patent to the basilar. The right vertebral artery is hypoplastic and particularly diminutive distal to the PICA origin. The basilar artery is patent and mildly irregular diffusely without a significant focal stenosis. There are large right and small left posterior communicating arteries with hypoplasia of the right P1 segment. The PCAs are patent and mildly irregular diffusely without a flow limiting proximal stenosis. No aneurysm is identified. Venous sinuses: Patent. Anatomic variants: Fetal right PCA.  Diminutive or absent right A1. Review of the  MIP images confirms the above findings   IMPRESSION: 1. Intracranial atherosclerosis without large vessel occlusion or flow limiting proximal stenosis. 2. 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 3. 5 mm left cavernous ICA aneurysm. 4. 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 5. Widely patent cervical carotid and vertebral arteries. Electronically Signed   By: Sebastian Ache M.D.   On: 08/20/2020 17:11   MR BRAIN WO CONTRAST Result Date: 08/20/2020  FINDINGS: Brain: No acute infarct, intracranial hemorrhage, midline shift, or extra-axial fluid collection is identified. A 5 mm T1 hyperintense nodule along the right ventral midbrain corresponds to fat density on CT and is consistent with a lipoma. T2 hyperintensities in the cerebral white matter bilaterally are nonspecific but compatible with mild-to-moderate chronic small vessel ischemic disease. Multiple small T2 hyperintensities in the midbrain primarily on the right are favored to reflect dilated perivascular spaces although a component of chronic ischemia is possible as well. There are small chronic right cerebellar infarcts. There is mild  cerebral atrophy. A partially empty sella is noted. Vascular: Major intracranial vascular flow voids are preserved. Multiple cerebral aneurysms as detailed on today's earlier CTA. 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 Sinuses/Orbits: Unremarkable orbits. Minimal secretions in the right maxillary sinus. Other: None.   IMPRESSION: 1. No acute intracranial abnormality. 2. Mild-to-moderate chronic small vessel ischemic disease. 3. Small chronic cerebellar infarcts. 4. 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 Electronically Signed   By: Sebastian Ache M.D.   On: 08/20/2020 18:46   ECHOCARDIOGRAM COMPLETE Result Date: 08/21/2020    ECHOCARDIOGRAM REPORT   Patient Name:   Ricky Santana Date of Exam: 08/21/2020 Medical Rec #:  [id number]  Height:        68.0 in Accession #:    [id number] Weight:       200.0 lb Date of Birth:  Jul 17, 1951 BSA:          2.044 m Patient Age:    7 years   BP:           136/92 mmHg Patient Gender: M          HR:           74 bpm. Exam Location:  Inpatient Procedure: 2D Echo, Color Doppler and Cardiac Doppler Indications:    Stroke i163.9  History:        Patient has no prior history of Echocardiogram examinations.                 Risk Factors:Hypertension and Diabetes.  Sonographer:    Irving Burton Senior RDCS Referring Phys: [id number] Elwin Mocha [place] IMPRESSIONS  1. Left ventricular ejection fraction, by estimation, is 55 to 60%. The left ventricle has normal function. The left ventricle has no regional wall motion abnormalities. Indeterminate diastolic filling due to E-A fusion.  2. Right ventricular systolic function is normal. The right ventricular size is normal.  3. Left atrial size was mildly dilated.  4. The mitral valve is grossly normal. No evidence of mitral valve regurgitation.  5. The aortic valve was not well visualized. Aortic valve regurgitation is not visualized. No aortic stenosis is present.  6. The inferior vena cava is normal in size with greater than 50% respiratory variability, suggesting right atrial pressure of 3 mmHg. Comparison(s): No prior Echocardiogram. Conclusion(s)/Recommendation(s): Normal biventricular function without evidence of hemodynamically significant valvular heart disease. FINDINGS  Left Ventricle: RWT 0.49; LVMI 93 g/m2. Left ventricular ejection fraction, by estimation, is 55 to 60%. The left ventricle has normal function. The left ventricle has no regional wall motion abnormalities. The left ventricular internal cavity size was normal in size. There is borderline concentric left ventricular hypertrophy. Indeterminate diastolic filling due to E-A fusion. Right Ventricle: The right ventricular size is normal. No increase in right ventricular wall thickness. Right ventricular systolic function  is normal. Left Atrium: Left atrial size was mildly dilated. Right Atrium: Right atrial size was normal in size. Pericardium: There is no evidence of pericardial effusion. Mitral Valve: The mitral valve is grossly normal. No evidence of mitral valve regurgitation. Tricuspid Valve: The tricuspid valve is grossly normal. Tricuspid valve regurgitation is trivial. Aortic Valve: The aortic valve was not well visualized. Aortic valve regurgitation is not visualized. No aortic stenosis is present. Pulmonic Valve: The pulmonic valve was not well visualized. Pulmonic valve regurgitation is not visualized. Aorta: The aortic root and ascending aorta are structurally normal, with no evidence of dilitation. Venous: The inferior  vena cava is normal in size with greater than 50% respiratory variability, suggesting right atrial pressure of 3 mmHg. IAS/Shunts: The atrial septum is grossly normal.  LEFT VENTRICLE PLAX 2D LVIDd:         4.50 cm  Diastology LVIDs:         3.20 cm  LV e' medial:    3.81 cm/s LV PW:         1.10 cm  LV E/e' medial:  10.8 LV IVS:        1.20 cm  LV e' lateral:   4.35 cm/s LVOT diam:     2.20 cm  LV E/e' lateral: 9.4 LV SV:         60 LV SV Index:   29 LVOT Area:     3.80 cm  RIGHT VENTRICLE RV S prime:     9.14 cm/s TAPSE (M-mode): 2.0 cm LEFT ATRIUM             Index       RIGHT ATRIUM           Index LA diam:        4.10 cm 2.01 cm/m  RA Area:     14.60 cm LA Vol (A2C):   54.0 ml 26.42 ml/m RA Volume:   29.40 ml  14.39 ml/m LA Vol (A4C):   70.9 ml 34.69 ml/m LA Biplane Vol: 62.4 ml 30.53 ml/m  AORTIC VALVE LVOT Vmax:   76.40 cm/s LVOT Vmean:  54.900 cm/s LVOT VTI:    0.157 m  AORTA Ao Root diam: 3.20 cm Ao Asc diam:  3.60 cm MITRAL VALVE MV Area (PHT): 1.94 cm    SHUNTS MV Decel Time: 391 msec    Systemic VTI:  0.16 m MV E velocity: 41.10 cm/s  Systemic Diam: 2.20 cm MV A velocity: 75.80 cm/s MV E/A ratio:  0.54 Riley Lam MD Electronically signed by Riley Lam MD Signature  Date/Time: 08/21/2020/3:11:48 PM    Final     Discharge Instructions: Discharge Instructions    Call MD for:  extreme fatigue   Complete by: As directed    Call MD for:  severe uncontrolled pain   Complete by: As directed    Diet Carb Modified   Complete by: As directed    Discharge instructions   Complete by: As directed    Mr. Ricky Santana, it has been a pleasure taking care of you. Your workup revealed several cerebral aneurysms, as well as an age-indeterminate stroke in your cerebellum. I am glad to hear that your symptoms seems to be resolved at this time. It will be important to control your blood pressure and blood sugar going forward to prevent further strokes.   We are sending you with a CD of your images so that your studies will not have to be repreated. I will also fax your records for Ricky Santana. Please have him contact us if he needs any further information. I am prescribing a short course of your medications if you need more while you are here.  We can see you in our clinic if you are around for more than a week longer, the number is [phone].  Here are your discharge instructions.  1) make an appointment with your PCP to manage your ongoing problems, and for a referral for your embolism treatment 2) Continue losartan  daily. Your blood pressure goal is <120/80. 3) continue your other medications  If you have a new severe headache, slurred speech, facial  droop, or weakness, please seek treatment immediately   Discharge wound care:   Complete by: As directed    Change bandage daily   Increase activity slowly   Complete by: As directed       Signed: Remo Lipps, MD 08/23/2020, 4:20 PM   Pager: [phone]     Internal Medicine Attending Note:  I saw and examined the patient on the day of discharge. I reviewed and agree with the discharge summary written by the house staff.  I personally spent greater than 60 minutes discussing the plan of care with the  patient and his family and coordinating his care with his PCP in [place].   Jessy Oto, M.D., Ph.D.

## 2020-08-22 NOTE — Progress Notes (Signed)
PT Cancellation Note  Patient Details Name: Ricky Santana MRN: [id number] DOB: 05-01-1951   Cancelled Treatment:    Reason Eval/Treat Not Completed: Patient at procedure or test/unavailable Pt currently off the floor for test/procedure. Will follow up as schedule allows.   Cindee Salt, DPT  Acute Rehabilitation Services  Pager: [phone] Office: [phone]    Ricky Santana 08/22/2020, 9:33 AM

## 2020-08-22 NOTE — Sedation Documentation (Signed)
Unable to obtain vitals, MD using BP cuff at clamped pressure

## 2020-08-22 NOTE — Procedures (Signed)
S/P 4 vesssel cerebral arteriogram RT rad approach. Findings. 1.Approx 5.24mm x 5.73mm Ly ICA cavernous carotid aneyrysm 2.Approx 10.65mmx 6.8 mm Lt ICA terminus fusiform aneurysm. 3.Approx 4.18mm x 3.3 mm Lt MCA wide neck bifurcation aneurysm. 4 .Approx 5.4 x 5.3 mm  Wide neck RT MCA aneurysm at origin of anterior temporal branch. S.Aladdin Kollmann MD

## 2020-08-22 NOTE — Progress Notes (Signed)
NIR.  Patient underwent an image-guided diagnostic cerebral arteriogram this AM by Dr. Corliss Skains (please see his procedure note regarding specific findings).  Results and management recommendations discussed with patient bedside, along with patient's brother and sister-in-law via telephone.   Per Dr. Corliss Skains- plan for embolization of aneurysm on OP basis in 08/2020. NIR schedulers to call patient to set up this procedure (patient/family encouraged to call schedulers regarding questions/request if additional consult reviewed).  From NIR standpoint, patient stable for discharge.  Further plans per IMTS/neurology- appreciate and agree with management. Please call NIR with questions/concerns.   Waylan Boga Amen Dargis, PA-C 08/22/2020, 12:16 PM

## 2020-08-22 NOTE — Progress Notes (Addendum)
   Subjective:   Patient feels well this morning, no changes from yesterday. Dizziness remains resolved, denies headache, denies focal weakness. We saw him after his angiogram and states it went well, not acute complaints.  Objective:  Vital signs in last 24 hours: Vitals:   08/22/20 0530 08/22/20 0600 08/22/20 0615 08/22/20 0630  BP: 120/86 113/82 104/72 (!) 110/94  Pulse: 79 73 73 93  Resp: 15 11 15  (!) 5  Temp:      TempSrc:      SpO2: 90% 100% 100% (!) 61%  Weight:      Height:        Physical Exam Constitutional: no acute distress Head: atraumatic ENT: external ears normal Eyes: EOMI Cardiovascular: regular rate and rhythm, normal heart sounds Pulmonary: effort normal, lungs clear to ascultation bilaterally Abdominal: flat, nontender, no rebound tenderness, bowel sounds normal Musculoskeletal: inflated dressing on R forearm without active bleeding Skin: warm and dry Neurological: alert, no focal deficit, cranial nerves intact, LUE exam limited due to angiogram earlier today but strength intact in other extremities. Nystagmus noted yesterday is no longer present. Psychiatric: normal mood and affect  Assessment/Plan: Ricky Santana is a 69 y.o. male with hx of HTN, DM, migraines presenting with acute onset gait unsteadiness with dizziness and headache. Stroke workup negative for acute ischemia but demonstrated multiple small aneurysms and chronic cerebellar infarct.  Principal Problem:   Intracranial aneurysm Active Problems:   Acute ataxia  # Multiple Intracranial Aneurysms  Current presentation could be a sentinal headache. Patient has Hx of complicated migraines with sensory disturbance. Cerebral angiogram today with 4 aneurysms in L ICA x2, L MCA, and R MCA, largest of which is 10.73mm x 6.71mm. No evidence of ICH on CT scan.  - neuro following, appreciate recs - VIR planning for embolization of aneurysm outpatient in January - blood pressure goal is normotension -  continue to monitor for recurrent headache/neck pain, neuro changes--page neurology stat if this occurs as it could indicate a bleed  # Chronic right cerebellar infarct  Dizziness has resolved, no neuro abnormalities on exam. Stroke work up nearly complete. MRI with No acute intracranial abnormality, Mild-to-moderate chronic small vessel ischemic disease, small chronic cerebellar infarcts. CTA and angiogram with aneurysms as above. Echo negative, no shunt. Lipid panel benign. A1c 6.9. RPR negative. TSH normal. OT and PT without further recommendations.  -continue ASA 81mg  -continue atorvastatin 40mg  -normotensive blood pressure goal given intracranial aneurysms -appreciate neuro recs  # Chronic hypertension. Neuro suggests tight control of BP. HTN currently well controlled. -continue losartan 100 mg.  -blood pressure goal is normotension.   # Type 2 DM. A1c of 6.9. On metformin 1000mg  bid and jardiance 100mg  daily at home -moderate SSI -will need tighter A1c control at home  Diet:  Carb modified IVF:  none VTE:  Deferred for high bleed risk Prior to Admission Living Arrangement:  home Anticipated Discharge Location:  home Barriers to Discharge:  Aneurysm workup Dispo: Anticipated discharge in approximately 0-1 day(s).   February, MD 08/22/2020, 7:16 AM Pager: [phone] After 5pm on weekdays and 1pm on weekends: On Call pager [phone]

## 2020-08-22 NOTE — Sedation Documentation (Signed)
Attempted to call report to 3W 

## 2020-08-23 LAB — CBC
HCT: 42.3 % (ref 39.0–52.0)
Hemoglobin: 13.5 g/dL (ref 13.0–17.0)
MCH: 30.3 pg (ref 26.0–34.0)
MCHC: 31.9 g/dL (ref 30.0–36.0)
MCV: 95.1 fL (ref 80.0–100.0)
Platelets: 196 10*3/uL (ref 150–400)
RBC: 4.45 MIL/uL (ref 4.22–5.81)
RDW: 13 % (ref 11.5–15.5)
WBC: 7.7 10*3/uL (ref 4.0–10.5)
nRBC: 0 % (ref 0.0–0.2)

## 2020-08-23 LAB — BASIC METABOLIC PANEL
Anion gap: 9 (ref 5–15)
BUN: 22 mg/dL (ref 8–23)
CO2: 25 mmol/L (ref 22–32)
Calcium: 8.6 mg/dL — ABNORMAL LOW (ref 8.9–10.3)
Chloride: 106 mmol/L (ref 98–111)
Creatinine, Ser: 1.49 mg/dL — ABNORMAL HIGH (ref 0.61–1.24)
GFR, Estimated: 51 mL/min — ABNORMAL LOW (ref >60)
Glucose, Bld: 139 mg/dL — ABNORMAL HIGH (ref 70–99)
Potassium: 4.2 mmol/L (ref 3.5–5.1)
Sodium: 140 mmol/L (ref 135–145)

## 2020-08-23 LAB — GLUCOSE, CAPILLARY
Glucose-Capillary: 135 mg/dL — ABNORMAL HIGH (ref 70–99)
Glucose-Capillary: 131 mg/dL — ABNORMAL HIGH (ref 70–99)
Glucose-Capillary: 163 mg/dL — ABNORMAL HIGH (ref 70–99)

## 2020-08-23 MED ORDER — CLOPIDOGREL BISULFATE 75 MG PO TABS
300.0000 mg | ORAL_TABLET | Freq: Once | ORAL | Status: AC
  Administered 2020-08-23: 300 mg via ORAL
  Filled 2020-08-23: qty 4

## 2020-08-23 MED ORDER — ASPIRIN EC 325 MG PO TBEC: 325.0000 mg | DELAYED_RELEASE_TABLET | Freq: Every day | ORAL | Status: DC

## 2020-08-23 MED ORDER — METFORMIN HCL 1000 MG PO TABS: 1000.0000 mg | ORAL_TABLET | Freq: Two times a day (BID) | ORAL | 0 refills | Status: AC

## 2020-08-23 MED ORDER — LOSARTAN POTASSIUM 100 MG PO TABS: 100.0000 mg | ORAL_TABLET | Freq: Every day | ORAL | 0 refills | Status: AC

## 2020-08-23 MED ORDER — ASPIRIN EC 81 MG PO TBEC
81.0000 mg | DELAYED_RELEASE_TABLET | Freq: Every day | ORAL | 0 refills | Status: AC
Start: 2020-08-23 — End: ?

## 2020-08-23 MED ORDER — ATORVASTATIN CALCIUM 40 MG PO TABS
40.0000 mg | ORAL_TABLET | Freq: Every day | ORAL | 0 refills | Status: AC
Start: 2020-08-23 — End: ?

## 2020-08-23 MED ORDER — CLOPIDOGREL BISULFATE 75 MG PO TABS: 75.0000 mg | ORAL_TABLET | Freq: Every day | ORAL | Status: DC

## 2020-08-23 MED ORDER — JANUVIA 100 MG PO TABS: 100.0000 mg | ORAL_TABLET | Freq: Every day | ORAL | 0 refills | Status: AC

## 2020-08-23 NOTE — Progress Notes (Signed)
   Subjective:   States all his family lives here in [place].  Discussed options with patient and he seems okay with pursuing surgery in [place]. Wants to discuss his options with his family today.  Denies headache, dizziness, or any other neuro symptoms at this time.  Objective:  Vital signs in last 24 hours: Vitals:   08/22/20 1605 08/22/20 2030 08/22/20 2315 08/23/20 0347  BP: (!) 144/81 119/81 105/81 102/64  Pulse: 99 84 79 71  Resp: 16 19 20 19   Temp: 98.4 F (36.9 C) 98.1 F (36.7 C) 98 F (36.7 C) 98.1 F (36.7 C)  TempSrc: Oral Oral Oral Oral  SpO2: 94% 91% 94% 98%  Weight:      Height:        Physical Exam Constitutional: no acute distress Head: atraumatic ENT: external ears normal Eyes: EOMI Cardiovascular: regular rate and rhythm, normal heart sounds Pulmonary: effort normal, lungs clear to ascultation bilaterally Abdominal: flat, nontender, no rebound tenderness, bowel sounds normal Musculoskeletal: Dressing on right forearm without bleeding Skin: warm and dry Neurological: alert, no focal deficit, cranial nerves intact Psychiatric: normal mood and affect  Assessment/Plan: Ricky Santana is a 69 y.o. male with hx of HTN, DM, migraines presenting with acute onset gait unsteadiness with dizziness and headache. Stroke workup negative for acute ischemia but demonstrated multiple small aneurysms and chronic cerebellar infarct.  Asymptomatic at this time, pending scheduling for embolization of aneurysms.  Principal Problem:   Intracranial aneurysm Active Problems:   Acute ataxia  # Multiple Intracranial Aneurysms  Current presentation could be a sentinal headache. Patient has Hx of complicated migraines with sensory disturbance. Cerebral angiogram with 4 aneurysms in L ICA x2, L MCA, and R MCA, largest of which is 10.6mm x 6.88mm. No evidence of ICH on CT scan.  - neuro signed off, appreciate assistance - VIR planning for embolization of aneurysm outpatient versus inpatient  basis - blood pressure goal is normotension - continue to monitor for recurrent headache/neck pain  # Chronic right cerebellar infarct  Dizziness has resolved, no neuro abnormalities on exam. Stroke work up complete. MRI with No acute intracranial abnormality, Mild-to-moderate chronic small vessel ischemic disease, small chronic cerebellar infarcts. CTA and angiogram with aneurysms as above. Echo negative, no shunt. Lipid panel benign. A1c 6.9. RPR negative. TSH normal. OT and PT without further recommendations.  -continue ASA 81mg  -continue atorvastatin 40mg  -normotensive blood pressure goal given intracranial aneurysms  # Chronic hypertension. Neuro recommends tight control of BP. HTN currently well controlled. -continue losartan 100 mg.  -blood pressure goal is normotension.   # Elevated creatinine Creatinine 1.49 < 1.27 < 1.20. Most likely due to contrast from angiogram yesterday. -encourage PO intake -BMP tomorrow  # Type 2 DM. A1c of 6.9. On metformin 1000mg  bid and jardiance 100mg  daily at home -moderate SSI -Continue tight A1c control  Diet:  Carb modified IVF:  none VTE:  Deferred for high bleed risk Prior to Admission Living Arrangement:  home Anticipated Discharge Location:  home Barriers to Discharge: Scheduling for aneurysm embolization Dispo: Anticipated discharge in approximately 0-1 day(s).   4m, MD 08/23/2020, 5:56 AM Pager: [phone] After 5pm on weekdays and 1pm on weekends: On Call pager [phone]

## 2020-08-23 NOTE — Progress Notes (Signed)
NIR.  Patient underwent an image-guided diagnostic cerebral arteriogram this AM by Dr. Corliss Skains (please see his procedure note from 08/22/2020 regarding specific findings).  Difficult outpatient follow-up (travel, lack of family support) due to patient being from out of state. NIR contacted by primary team who has following concerns- patient from [place] and unable to travel back to [facility] from [place] for procedure to occur on OP basis, if procedure occurs in [place] patient has no family support as family is here. Efforts are being made to have procedure occur on IP basis during this admission. Dr. Corliss Skains working with schedulers/reps regarding procedure scheduling/appropriate devices/equiptment for procedure- at this time, the earliest procedure can occur on IP basis is Monday 08/29/2019, however procedure has not been scheduled at this time until above complete.  In anticipation for possible upcoming procedure, please begin DAPT as below (orders placed for this): 1- Plavix 300 mg x 1 today. 2- Plavix 75 mg once daily beginning tomorrow. 3- Aspirin 325 mg once daily beginning today.  NIR to come speak with patient regarding plans once Dr. Corliss Skains determines above. IMTS made aware of above. NIR to follow.   Waylan Boga Keymora Grillot, PA-C 08/23/2020, 11:30 AM

## 2020-08-23 NOTE — Progress Notes (Signed)
Patient for discharge home today.  IV discontinued,  AVS discussed and both CDs from radiology given to patient.  Patient denies any discomfort,  Vital sign stable.  awating brother for transport home.

## 2020-08-23 NOTE — Evaluation (Signed)
Speech Language Pathology Evaluation Patient Details Name: Ricky Santana MRN: [id number] DOB: 1951/02/21 Today's Date: 08/23/2020 Time: 6629-4765 SLP Time Calculation (min) (ACUTE ONLY): 21 min  Problem List:  Patient Active Problem List   Diagnosis Date Noted  . Intracranial aneurysm 08/21/2020  . Acute ataxia 08/20/2020   Past Medical History:  Past Medical History:  Diagnosis Date  . Diabetes mellitus without complication (HCC)   . Hypertension    Past Surgical History: History reviewed. No pertinent surgical history. HPI:  26 yom with hypertension and diabetes to presented to [facility] 12/26 for evaluation of dizziness and gait disturbance.   He notes that he awoke in the middle of the night, prior to admission, and was dizzy. He went back to sleep and found that the dizziness was still present. When he attempted to walk, he had to hold onto the walls and railing as he felt off balance. Upon getting downstairs, his sister checked his blood pressure and found it to be 170/90.   He does endorse experiencing a headache and some neck discomfort at the time which has since resolved. Denies recent fever   He denies experiencing chest pain, shortness of breath, palpitations, syncope, focal weakness, speech change.     He is currently visiting family in [place] for the holiday. He resides in [place] and is retired. He denies any recent illness or other symptoms. 08/20/20 MRI head indicated No acute intracranial abnormality. 2. Mild-to-moderate chronic small vessel ischemic disease. 3. Small chronic cerebellar infarcts. 4. 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  Assessment / Plan / Recommendation Clinical Impression  Pt administered the SLUMS ([employer] Mental Status Examination) with a score of 28/30 obtained with 27/30 being a normal score for this assessment.  Pt oriented x4,  able to perform simple calculation tasks, word retrieval (memory task) 5/5 objects after time constraint, perform various sustained attention tasks and answer auditory comprehension questions from a short paragraph accurately.  Pt aware of pending surgery and implications that may arise.  Discussed potential for ST post-surgery prn.  Pt visiting family from [place] when he became dizzy and had difficulty with balance during walking down stairs at brother's residence on 08/20/20, but symptoms have resolved per pt.  ST will keep pt on caseload if continues as inpatient pending surgery and OP ST recommended post-surgical procedure prn for cognitive-linguistic changes.  Thank you for this consult.    SLP Assessment  SLP Recommendation/Assessment: Patient needs continued Speech Language Pathology Services SLP Visit Diagnosis: Other (comment)    Follow Up Recommendations    TBD   Frequency and Duration min 1 x/week  1 week      SLP Evaluation Cognition  Overall Cognitive Status: Within Functional Limits for tasks assessed Arousal/Alertness: Awake/alert Orientation Level: Oriented X4 Attention: Sustained Sustained Attention: Appears intact Memory: Appears intact Immediate Memory Recall: Sock;Blue;Bed Memory Recall Sock: Without Cue Memory Recall Blue: Without Cue Memory Recall Bed: Without Cue Awareness: Appears intact Problem Solving: Appears intact Safety/Judgment: Appears intact       Comprehension  Auditory Comprehension Overall Auditory Comprehension: Appears within functional limits for tasks assessed Conversation: Complex Visual Recognition/Discrimination Discrimination: Within Function Limits Reading Comprehension Reading Status: Within funtional limits    Expression Expression Primary Mode of Expression: Verbal Verbal Expression Overall Verbal Expression: Appears within functional limits for tasks assessed Level of Generative/Spontaneous Verbalization:  Conversation Repetition: No impairment Naming: No  impairment Pragmatics: No impairment Non-Verbal Means of Communication: Not applicable Written Expression Dominant Hand: Right Written Expression: Within Functional Limits   Oral / Motor  Oral Motor/Sensory Function Overall Oral Motor/Sensory Function: Within functional limits Motor Speech Overall Motor Speech: Appears within functional limits for tasks assessed Respiration: Within functional limits Phonation: Normal Resonance: Within functional limits Articulation: Within functional limitis Intelligibility: Intelligible Motor Planning: Witnin functional limits Motor Speech Errors: Not applicable                      Tressie Stalker, M.S., CCC-SLP 08/23/2020, 11:38 AM

## 2020-08-23 NOTE — Progress Notes (Signed)
Physical Therapy Treatment & Discharge  Patient Details Name: Ricky Santana MRN: [id number] DOB: 02/13/1951 Today's Date: 08/23/2020    History of Present Illness 69 yo male presenting to [facility] on 08/[facility]/20 with dizziness and gait disturbance.  In ED, CTA head/neck revealed incidental findings of three cerebral arteriograms.  Neurology was consulted who recommended NIR consult for possible diagnostic cerebral arteriogram to evaluate cerebral aneurysms. PMH including , migraines, and diabetes mellitus.    PT Comments    Pt progressing significantly with an ability to ambulate up to ~350 ft without an AD and navigate 4 stairs with use of rails with only supervision. When cued to change head positions pt did demonstrate a slight stagger but appropriately reacted and recovered his balance. In addition, he tends to slow his gait when facing obstacles during DGI this date. He reports this being normal for him. While his DGI this date was 17 he demonstrated appropriate reactive strategies and awareness of his deficits to maintain his safety. Due to pt most likely being close to if not at baseline and his progress pt being discharged from acute PT services at this time. All education provided and questions answered. PT will sign off.   Follow Up Recommendations  No PT follow up     Equipment Recommendations  None recommended by PT    Recommendations for Other Services       Precautions / Restrictions Precautions Precautions: None Restrictions Weight Bearing Restrictions: No    Mobility  Bed Mobility               General bed mobility comments: Pt sitting up EOB upon arrival.  Transfers Overall transfer level: Independent   Transfers: Sit to/from Stand           General transfer comment: Sit to stand independently without LOB or trunk sway.  Ambulation/Gait Ambulation/Gait assistance: Supervision Gait Distance (Feet): 350 Feet Assistive device: None Gait Pattern/deviations:  WFL(Within Functional Limits) Gait velocity: WFL Gait velocity interpretation: >4.37 ft/sec, indicative of normal walking speed General Gait Details: Pt ambulates with steady gait and mild deviations/ staggering with changes in head positions or challenges during DGI. Pt demonstrates appropriate safe responses to maintain balance and safety throughout.   Stairs Stairs: Yes Stairs assistance: Supervision Stair Management: Two rails;Alternating pattern Number of Stairs: 4 General stair comments: Ascends and descends stairs with reciprocal pattern and use of rails with no LOB or trunk sway noted. Supervision for safety.   Wheelchair Mobility    Modified Rankin (Stroke Patients Only) Modified Rankin (Stroke Patients Only) Pre-Morbid Rankin Score: No symptoms Modified Rankin: Slight disability     Balance Overall balance assessment: Mild deficits observed, not formally tested                               Standardized Balance Assessment Standardized Balance Assessment : Dynamic Gait Index   Dynamic Gait Index Level Surface: Normal Change in Gait Speed: Normal Gait with Horizontal Head Turns: Mild Impairment Gait with Vertical Head Turns: Mild Impairment Gait and Pivot Turn: Normal Step Over Obstacle: Mild Impairment Step Around Obstacles: Mild Impairment Steps: Mild Impairment Total Score: 19      Cognition Arousal/Alertness: Awake/alert Behavior During Therapy: WFL for tasks assessed/performed Overall Cognitive Status: Within Functional Limits for tasks assessed  Exercises      General Comments        Pertinent Vitals/Pain Pain Assessment: No/denies pain    Home Living                      Prior Function            PT Goals (current goals can now be found in the care plan section) Acute Rehab PT Goals Patient Stated Goal: Go home PT Goal Formulation: With patient Time For Goal  Achievement: 08/28/20 Potential to Achieve Goals: Good Progress towards PT goals: Progressing toward goals;Goals met/education completed, patient discharged from PT    Frequency    Min 4X/week      PT Plan Other (comment);Current plan remains appropriate (plan to d/c from acute PT this date)    Co-evaluation              AM-PAC PT "6 Clicks" Mobility   Outcome Measure  Help needed turning from your back to your side while in a flat bed without using bedrails?: None Help needed moving from lying on your back to sitting on the side of a flat bed without using bedrails?: None Help needed moving to and from a bed to a chair (including a wheelchair)?: None Help needed standing up from a chair using your arms (e.g., wheelchair or bedside chair)?: None Help needed to walk in hospital room?: A Little Help needed climbing 3-5 steps with a railing? : A Little 6 Click Score: 22    End of Session Equipment Utilized During Treatment: Gait belt Activity Tolerance: Patient tolerated treatment well Patient left: in bed;with call bell/phone within reach;with bed alarm set   PT Visit Diagnosis: Unsteadiness on feet (R26.81)     Time: 3794-3276 PT Time Calculation (min) (ACUTE ONLY): 10 min  Charges:  $Gait Training: 8-22 mins                     Moishe Spice, PT, DPT Acute Rehabilitation Services  Pager: [phone] Office: [place] 08/23/2020, 5:15 PM

## 2020-08-24 ENCOUNTER — Emergency Department (HOSPITAL_COMMUNITY)
Admission: EM | Admit: 2020-08-24 | Discharge: 2020-08-24 | Disposition: A | Discharge status: Home / Self Care | Payer: Medicare (Managed Care) | Attending: Emergency Medicine | Admitting: Emergency Medicine

## 2020-08-24 ENCOUNTER — Encounter (HOSPITAL_COMMUNITY): Payer: Self-pay | Admitting: Emergency Medicine

## 2020-08-24 DIAGNOSIS — R42 Dizziness and giddiness: Secondary | ICD-10-CM | POA: Diagnosis present

## 2020-08-24 DIAGNOSIS — Z5321 Procedure and treatment not carried out due to patient leaving prior to being seen by health care provider: Secondary | ICD-10-CM | POA: Diagnosis not present

## 2020-08-24 LAB — CBC
HCT: 45.9 % (ref 39.0–52.0)
Hemoglobin: 14.7 g/dL (ref 13.0–17.0)
MCH: 30.7 pg (ref 26.0–34.0)
MCHC: 32 g/dL (ref 30.0–36.0)
MCV: 95.8 fL (ref 80.0–100.0)
Platelets: 220 10*3/uL (ref 150–400)
RBC: 4.79 MIL/uL (ref 4.22–5.81)
RDW: 12.6 % (ref 11.5–15.5)
WBC: 8.2 10*3/uL (ref 4.0–10.5)
nRBC: 0 % (ref 0.0–0.2)

## 2020-08-24 LAB — BASIC METABOLIC PANEL
Anion gap: 11 (ref 5–15)
BUN: 18 mg/dL (ref 8–23)
CO2: 24 mmol/L (ref 22–32)
Calcium: 9.6 mg/dL (ref 8.9–10.3)
Chloride: 104 mmol/L (ref 98–111)
Creatinine, Ser: 1.39 mg/dL — ABNORMAL HIGH (ref 0.61–1.24)
GFR, Estimated: 56 mL/min — ABNORMAL LOW (ref >60)
Glucose, Bld: 122 mg/dL — ABNORMAL HIGH (ref 70–99)
Potassium: 4.4 mmol/L (ref 3.5–5.1)
Sodium: 139 mmol/L (ref 135–145)

## 2020-08-24 NOTE — ED Notes (Signed)
Pt states he is leaving due to wait time  

## 2020-08-24 NOTE — Progress Notes (Signed)
NIR.  Patient discharged overnight per primary service with plans for endovascular aneurysm embolization(s) in [place] (patient's home state). DAPT was not started as no plans for NIR procedure at this time. Dr. Corliss Skains made aware.  Please call NIR with questions/concerns.   Waylan Boga Suki Crockett, PA-C 08/24/2020, 8:14 AM

## 2020-08-24 NOTE — ED Triage Notes (Signed)
Pt reports seen here last night for dizziness, discharged, upon driving today he became dizzy again around 11 am. Pt a/ox4, speech clear, face symmetric, no neuro deficits.

## 2020-08-30 ENCOUNTER — Encounter (HOSPITAL_COMMUNITY): Payer: Self-pay

## 2021-11-01 IMAGING — XA IR ANGIO INTRA EXTRACRAN SEL COM CAROTID INNOMINATE BILAT MOD SE
1 series · 10 of 24 positions shown · IV contrast (IODINE)
Comparison: CT angiogram of the head and neck August 20, 2020,
and MRI of the brain August 20, 2020.

CLINICAL DATA: Episodes of transient dizziness. Workup with CT
angiogram revealed presence of multiple intracranial aneurysms.

EXAM:
BILATERAL COMMON CAROTID AND INNOMINATE ANGIOGRAPHY
TECHNIQUE: Informed written consent was obtained from the patient after a
thorough discussion of the procedural risks, benefits and
alternatives. All questions were addressed. Maximal Sterile Barrier
Technique was utilized including caps, mask, sterile gowns, sterile
gloves, sterile drape, hand hygiene and skin antiseptic. A timeout
was performed prior to the initiation of the procedure.

[Series 300: dr. (person_name) · 10 of 154 slices shown]
[im 7/154]
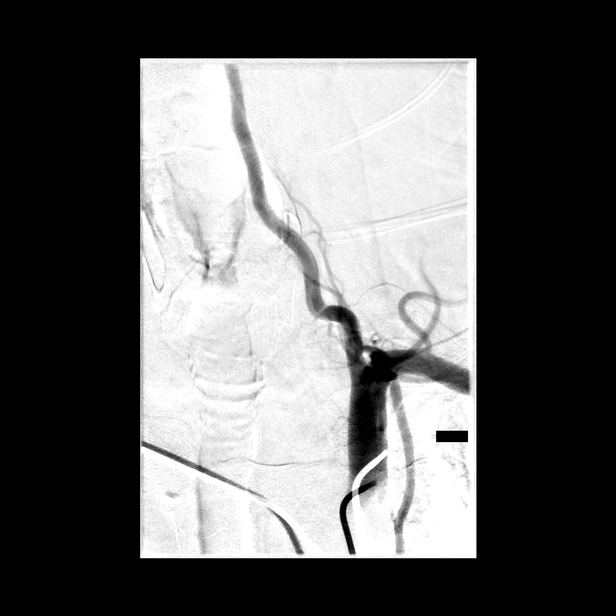
[im 20/154]
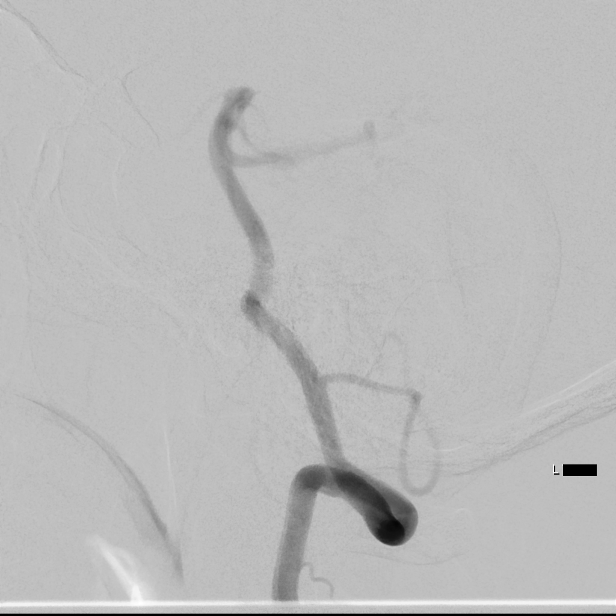
[im 40/154]
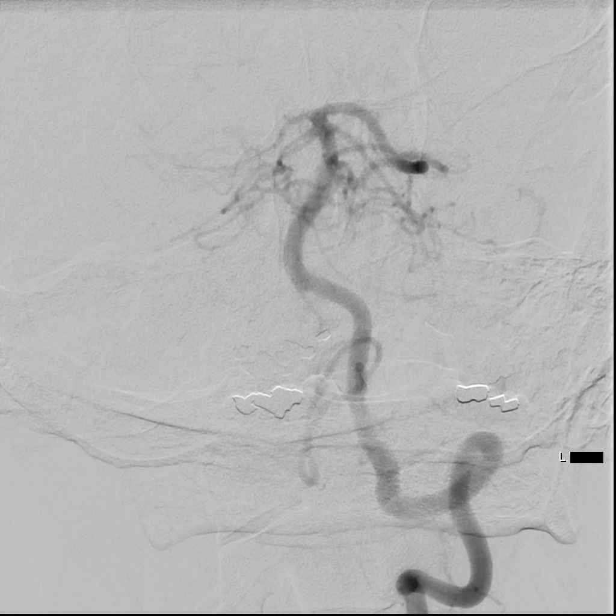
[im 54/154]
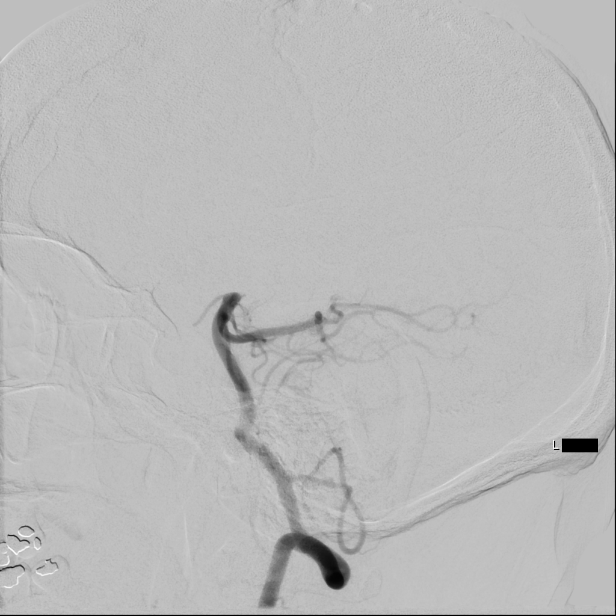
[im 67/154]
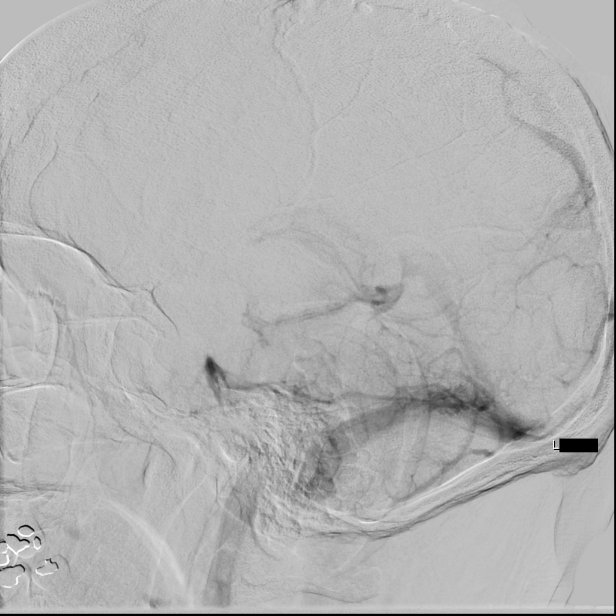
[im 87/154]
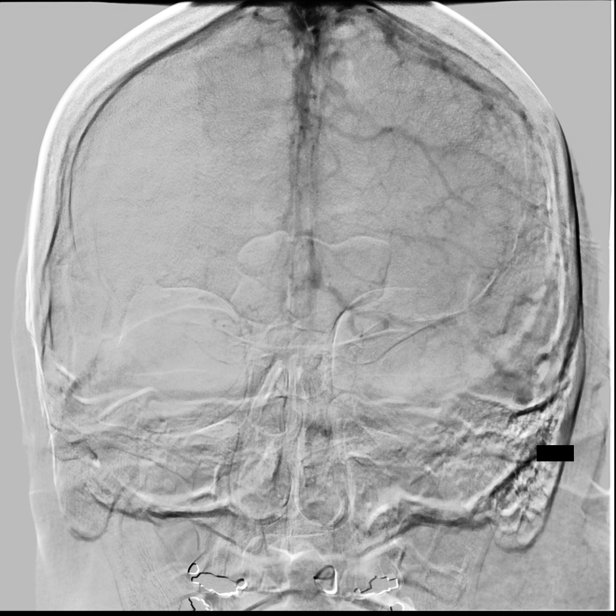
[im 100/154]
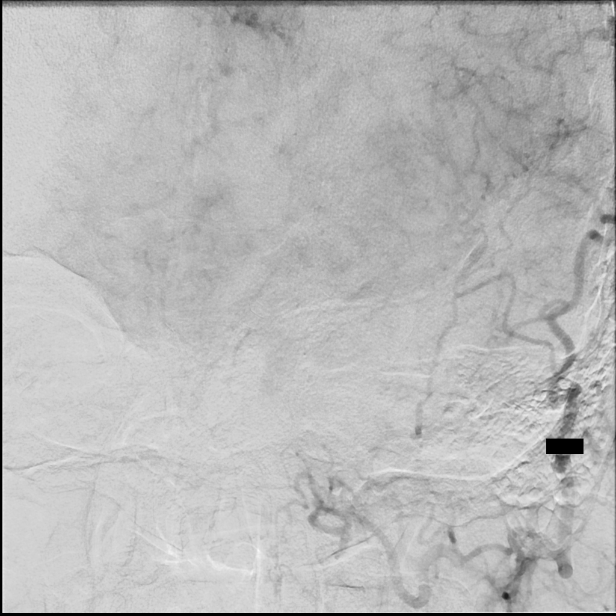
[im 120/154]
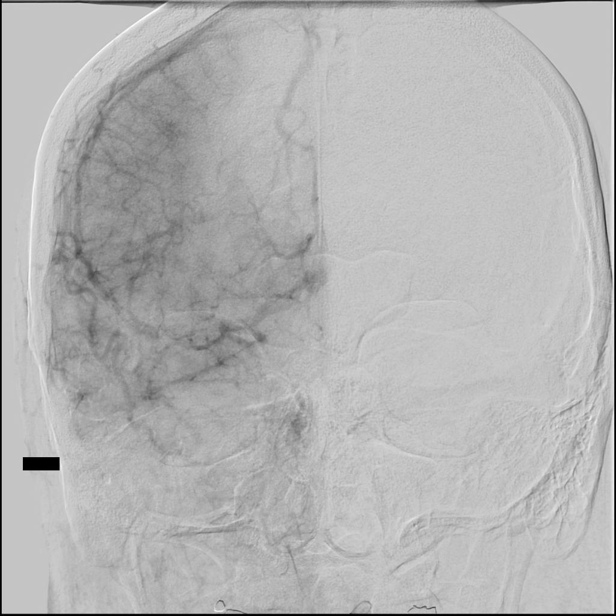
[im 134/154]
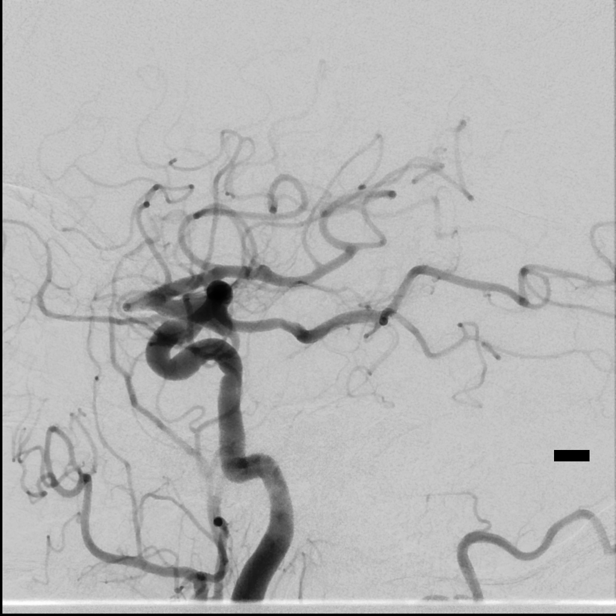
[im 147/154]
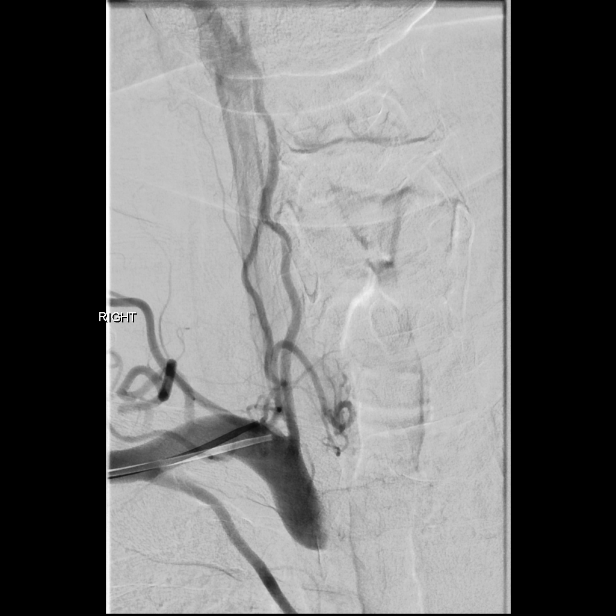

[10 of 24 positions shown; findings below may reference images not displayed]

MEDICATIONS:
Heparin 0888 units IA. None antibiotic was administered within 1
hour of the procedure.

ANESTHESIA/SEDATION:
Versed 1 mg IV; Fentanyl 25 mcg IV

Moderate Sedation Time:  66 minutes

The patient was continuously monitored during the procedure by the
interventional radiology nurse under my direct supervision.

CONTRAST:  Isovue 300 approximately 75 mL

FLUOROSCOPY TIME:  Fluoroscopy Time: 13 minutes 0 seconds (823 mGy).

COMPLICATIONS:
None
The right forearm to the wrist was prepped and draped in the usual
sterile manner. The right radial artery was then identified with
ultrasound, and its morphology documented. A dorsal palmar
anastomosis was verified to be present. Using ultrasound guidance,
access into the right radial artery was obtained with a
micropuncture set over a 0.018 inch micro guidewire. A [DATE] French
radial sheath was then inserted over the micro guidewire.

The micro guidewire, and the obturator were removed. Good aspiration
obtained from the side port of the sheath. A cocktail of 0888 units
of heparin, 2.5 mg of verapamil, and 200 mcg of nitroglycerin was
then infused through the sheath in diluted form without event. A
right radial arteriogram was obtained.

Over a 0.035 inch Roadrunner guidewire, a 5 Maeph Rajalha 2
diagnostic catheter was advanced to the aortic arch region, and
select cannulation was performed of the left common carotid artery,
the right common carotid artery, the left subclavian artery, and the
right subclavian artery.

Also performed was a 3D rotational arteriogram via the left and the
right common carotid arteries with 3D reconstruction on a separate
workstation.

Following the procedure, hemostasis at the right radial puncture
site was achieved with a wrist band. The distal right radial artery
was then verified to be present.
FINDINGS: .:
FINDINGS: .
The origin of the dominant left vertebral artery is widely patent.

The vessel is seen to opacify to the cranial skull base. Patency is
seen of the vertebrobasilar junction and the left posterior-inferior
cerebellar artery.

The PICA demonstrates mild segmental fusiform dilatation at the
junction of the mid to distal left posterior-inferior cerebellar
artery.

More distally the basilar artery, the left posterior cerebral
artery, the superior cerebellar arteries and the anterior-inferior
cerebellar arteries opacify into the capillary and venous phases.

A duplicated right superior cerebellar artery complex a
developmental variation is seen.

Incomplete opacification of the right posterior cerebral artery is
probably related to unopacified blood via the dominant right
posterior communicating artery.

The left common carotid arteriogram demonstrates the left external
carotid artery and its major branches to be widely patent.

The left internal carotid artery at the bulb to the cranial skull
base demonstrates patency.

Patency is maintained of the petrous, cavernous and the supraclinoid
segments.

There is an approximately 4.6 mm x 5 mm saccular aneurysm arising
from the medial aspect of the left internal carotid artery at the
proximal cavernous region. This appears to have a narrow neck.

There is diffuse fusiform dilatation of the supraclinoid left ICA
from the origin of the left anterior choroidal artery to the left
internal carotid artery terminus. Also noted is mild fusiform
dilatation of the proximal left M1 and the left A1 segments.

The left middle cerebral artery and the left anterior cerebral
artery opacify into the capillary and venous phases.

Arising in the left middle cerebral artery bifurcation is a saccular
outpouching with a wide neck.

A 3D rotational arteriogram was performed injecting from the left
common carotid artery with reformations performed on a separate
workstation.

This confirms the presence of the left internal carotid artery
proximal cavernous aneurysm with the small neck. Also confirms
presence of a wide neck left middle cerebral artery bifurcation
aneurysm measuring 4.1 mm x 3.3 mm. It also reveals mild-to-moderate
focal narrowing of the left internal carotid supraclinoid segment at
the origin of the left anterior choroidal artery.

The fusiform distal left internal carotid artery aneurysm extending
into the terminus measures approximately 7 mm at its widest at the
carotid terminus, with a length of approximately 12 mm.

Also demonstrated is cross-filling via the anterior communicating
artery of the right anterior cerebral A2 segment and distally.

The right common carotid arteriogram demonstrates the right external
carotid artery and its major branches to be widely patent.

The right internal carotid artery at the bulb to the cranial skull
base is widely patent.

The petrous, the cavernous and the supraclinoid segments are widely
patent.

A right posterior communicating artery is seen opacifying the right
posterior cerebral artery distribution.

The right middle cerebral artery in the region of the anterior
temporal branch demonstrates a mild to moderate fusiform dilatation
with a rounded saccular component extending into the origin of the
anterior temporal branch. This measures approximately 6 mm x 4.5 mm.
The widest MCA diameter at this site is approximately 3.2 mm.

No angiographic evidence of the right anterior cerebral A1 segment
is identified into the delayed arterial phase.

The right subclavian arteriogram demonstrates a hypoplastic right
vertebral artery which opacifies distally to the cranial skull base
and supplies primarily the ipsilateral right posterior-inferior
cerebellar artery.
IMPRESSION: Approximately 4.6 mm x 5 mm saccular aneurysm arising from the
medial aspect of the left internal carotid artery cavernous segment.

Prominent fusiform aneurysm arising from the left internal carotid
artery distal supraclinoid ICA extending into the left internal
carotid artery terminus measuring at its maximum 7 mm width and 12
mm in length.

Approximately 4.1 mm x 3.3 mm wide neck left middle cerebral artery
bifurcation aneurysm.

Approximately 6 mm x 4.5 mm right middle cerebral artery aneurysm
involving the origin of the anterior temporal branch associated with
a fusiform component.

Mild fusiform dilatation of the junction of the mid to distal left
posterior-inferior cerebellar artery.

PLAN:
Findings reviewed with the patient and referring neurologist.
Management of the aneurysm described above was briefly discussed
with the patient.

The patient expressed his desire to have the aneurysms managed
endovascularly.

Tentatively the plan is for the left internal carotid artery
proximal cavernous aneurysm and the left middle cerebral artery
bifurcation aneurysm to be treated initially.

This will be scheduled as early as possible. Dual antiplatelets,
aspirin and Plavix or Brilinta will be started at least 7 days prior
to the planned procedure.

The patient was alerted to be aware of sudden severe headaches, loss
of consciousness, seizures, photophobia, nausea, vomiting, and
instructed to call 911 should these symptoms occur. Patient
expressed understanding and agreement with the above management
